# Patient Record
Sex: Male | Born: 1940 | Race: White | Hispanic: No | Marital: Married | Smoking: Former smoker
Health system: Southern US, Community
[De-identification: ages and names within clinical notes are randomized; demographics above are authoritative.]

## PROBLEM LIST (undated history)

## (undated) DIAGNOSIS — I471 Supraventricular tachycardia, unspecified: Secondary | ICD-10-CM

## (undated) DIAGNOSIS — A4102 Sepsis due to Methicillin resistant Staphylococcus aureus: Secondary | ICD-10-CM

## (undated) DIAGNOSIS — I2510 Atherosclerotic heart disease of native coronary artery without angina pectoris: Secondary | ICD-10-CM

## (undated) DIAGNOSIS — J449 Chronic obstructive pulmonary disease, unspecified: Secondary | ICD-10-CM

## (undated) DIAGNOSIS — I96 Gangrene, not elsewhere classified: Secondary | ICD-10-CM

## (undated) DIAGNOSIS — J189 Pneumonia, unspecified organism: Secondary | ICD-10-CM

## (undated) DIAGNOSIS — J9621 Acute and chronic respiratory failure with hypoxia: Secondary | ICD-10-CM

## (undated) DIAGNOSIS — C3490 Malignant neoplasm of unspecified part of unspecified bronchus or lung: Secondary | ICD-10-CM

## (undated) DIAGNOSIS — I482 Chronic atrial fibrillation, unspecified: Secondary | ICD-10-CM

## (undated) HISTORY — PX: OTHER SURGICAL HISTORY: SHX169

---

## 2017-08-31 ENCOUNTER — Inpatient Hospital Stay
Admission: RE | Admit: 2017-08-31 | Discharge: 2017-09-12 | Disposition: A | Discharge status: Other Acute Inpatient Hospital | Payer: Medicare Other | Source: Ambulatory Visit | Attending: Internal Medicine | Admitting: Internal Medicine

## 2017-08-31 ENCOUNTER — Other Ambulatory Visit (HOSPITAL_COMMUNITY): Payer: Medicare Other

## 2017-08-31 DIAGNOSIS — J9621 Acute and chronic respiratory failure with hypoxia: Secondary | ICD-10-CM | POA: Diagnosis present

## 2017-08-31 DIAGNOSIS — J189 Pneumonia, unspecified organism: Secondary | ICD-10-CM

## 2017-08-31 DIAGNOSIS — J449 Chronic obstructive pulmonary disease, unspecified: Secondary | ICD-10-CM | POA: Diagnosis present

## 2017-08-31 DIAGNOSIS — I482 Chronic atrial fibrillation, unspecified: Secondary | ICD-10-CM | POA: Diagnosis present

## 2017-08-31 DIAGNOSIS — A4102 Sepsis due to Methicillin resistant Staphylococcus aureus: Secondary | ICD-10-CM

## 2017-08-31 DIAGNOSIS — J9690 Respiratory failure, unspecified, unspecified whether with hypoxia or hypercapnia: Secondary | ICD-10-CM

## 2017-08-31 DIAGNOSIS — Z4659 Encounter for fitting and adjustment of other gastrointestinal appliance and device: Secondary | ICD-10-CM

## 2017-08-31 HISTORY — DX: Chronic atrial fibrillation, unspecified: I48.20

## 2017-08-31 HISTORY — DX: Sepsis due to methicillin resistant Staphylococcus aureus: A41.02

## 2017-08-31 HISTORY — DX: Supraventricular tachycardia, unspecified: I47.10

## 2017-08-31 HISTORY — DX: Malignant neoplasm of unspecified part of unspecified bronchus or lung: C34.90

## 2017-08-31 HISTORY — DX: Gangrene, not elsewhere classified: I96

## 2017-08-31 HISTORY — DX: Supraventricular tachycardia: I47.1

## 2017-08-31 HISTORY — DX: Atherosclerotic heart disease of native coronary artery without angina pectoris: I25.10

## 2017-08-31 HISTORY — DX: Acute and chronic respiratory failure with hypoxia: J96.21

## 2017-08-31 HISTORY — DX: Pneumonia, unspecified organism: J18.9

## 2017-08-31 HISTORY — DX: Chronic obstructive pulmonary disease, unspecified: J44.9

## 2017-08-31 LAB — BLOOD GAS, ARTERIAL
Acid-Base Excess: 14.9 mmol/L — ABNORMAL HIGH (ref 0.0–2.0)
Bicarbonate: 39.1 mmol/L — ABNORMAL HIGH (ref 20.0–28.0)
FIO2: 50
LHR: 14 {breaths}/min
MECHVT: 500 mL
O2 SAT: 97.1 %
PATIENT TEMPERATURE: 98.6
PCO2 ART: 48 mmHg (ref 32.0–48.0)
PEEP: 8 cmH2O
PH ART: 7.521 — AB (ref 7.350–7.450)
PO2 ART: 86.7 mmHg (ref 83.0–108.0)

## 2017-09-01 ENCOUNTER — Encounter: Payer: Self-pay | Admitting: Internal Medicine

## 2017-09-01 DIAGNOSIS — J189 Pneumonia, unspecified organism: Secondary | ICD-10-CM

## 2017-09-01 DIAGNOSIS — J449 Chronic obstructive pulmonary disease, unspecified: Secondary | ICD-10-CM | POA: Diagnosis not present

## 2017-09-01 DIAGNOSIS — A4102 Sepsis due to Methicillin resistant Staphylococcus aureus: Secondary | ICD-10-CM

## 2017-09-01 DIAGNOSIS — I482 Chronic atrial fibrillation, unspecified: Secondary | ICD-10-CM | POA: Diagnosis present

## 2017-09-01 DIAGNOSIS — J9621 Acute and chronic respiratory failure with hypoxia: Secondary | ICD-10-CM | POA: Diagnosis not present

## 2017-09-01 HISTORY — DX: Sepsis due to methicillin resistant Staphylococcus aureus: A41.02

## 2017-09-01 HISTORY — DX: Pneumonia, unspecified organism: J18.9

## 2017-09-01 LAB — COMPREHENSIVE METABOLIC PANEL
ALK PHOS: 97 U/L (ref 38–126)
ALT: 45 U/L (ref 17–63)
AST: 110 U/L — AB (ref 15–41)
Albumin: 1.3 g/dL — ABNORMAL LOW (ref 3.5–5.0)
Anion gap: 9 (ref 5–15)
BILIRUBIN TOTAL: 1.2 mg/dL (ref 0.3–1.2)
BUN: 26 mg/dL — AB (ref 6–20)
CALCIUM: 8.4 mg/dL — AB (ref 8.9–10.3)
CO2: 32 mmol/L (ref 22–32)
CREATININE: 0.65 mg/dL (ref 0.61–1.24)
Chloride: 103 mmol/L (ref 101–111)
GFR calc Af Amer: >60 mL/min (ref >60)
Glucose, Bld: 137 mg/dL — ABNORMAL HIGH (ref 65–99)
Potassium: 4.2 mmol/L (ref 3.5–5.1)
Sodium: 144 mmol/L (ref 135–145)
TOTAL PROTEIN: 5.5 g/dL — AB (ref 6.5–8.1)

## 2017-09-01 LAB — CBC
HCT: 32.4 % — ABNORMAL LOW (ref 39.0–52.0)
Hemoglobin: 10 g/dL — ABNORMAL LOW (ref 13.0–17.0)
MCH: 27 pg (ref 26.0–34.0)
MCHC: 30.9 g/dL (ref 30.0–36.0)
MCV: 87.6 fL (ref 78.0–100.0)
PLATELETS: 221 10*3/uL (ref 150–400)
RBC: 3.7 MIL/uL — ABNORMAL LOW (ref 4.22–5.81)
RDW: 17.9 % — AB (ref 11.5–15.5)
WBC: 16.3 10*3/uL — AB (ref 4.0–10.5)

## 2017-09-01 LAB — PROTIME-INR
INR: 1.27
Prothrombin Time: 15.8 seconds — ABNORMAL HIGH (ref 11.4–15.2)

## 2017-09-01 MED ORDER — MORPHINE SULFATE 2 MG/ML IJ SOLN
2.00 | INTRAMUSCULAR | Status: DC
Start: ? — End: 2017-09-01

## 2017-09-01 MED ORDER — SPIRONOLACTONE 25 MG PO TABS
25.00 | ORAL_TABLET | ORAL | Status: DC
Start: 2017-09-01 — End: 2017-09-01

## 2017-09-01 MED ORDER — PROPOFOL 100 MG/10ML IV EMUL
0.00 | INTRAVENOUS | Status: DC
Start: ? — End: 2017-09-01

## 2017-09-01 MED ORDER — INSULIN GLARGINE 100 UNIT/ML SOLOSTAR PEN
30.00 | PEN_INJECTOR | SUBCUTANEOUS | Status: DC
Start: 2017-09-01 — End: 2017-09-01

## 2017-09-01 MED ORDER — PANTOPRAZOLE SODIUM 40 MG IV SOLR
40.00 | INTRAVENOUS | Status: DC
Start: 2017-08-31 — End: 2017-09-01

## 2017-09-01 MED ORDER — ACETAMINOPHEN 325 MG PO TABS
650.00 | ORAL_TABLET | ORAL | Status: DC
Start: ? — End: 2017-09-01

## 2017-09-01 MED ORDER — GENERIC EXTERNAL MEDICATION
500.00 | Status: DC
Start: 2017-08-31 — End: 2017-09-01

## 2017-09-01 MED ORDER — INSULIN REGULAR HUMAN 100 UNIT/ML IJ SOLN
0.00 | INTRAMUSCULAR | Status: DC
Start: 2017-08-31 — End: 2017-09-01

## 2017-09-01 MED ORDER — GENERIC EXTERNAL MEDICATION
Status: DC
Start: ? — End: 2017-09-01

## 2017-09-01 MED ORDER — FLUCONAZOLE IN SODIUM CHLORIDE 200-0.9 MG/100ML-% IV SOLN
200.00 | INTRAVENOUS | Status: DC
Start: 2017-09-01 — End: 2017-09-01

## 2017-09-01 MED ORDER — VITAMIN C 250 MG PO TABS
500.00 | ORAL_TABLET | ORAL | Status: DC
Start: 2017-09-01 — End: 2017-09-01

## 2017-09-01 MED ORDER — ONDANSETRON HCL 4 MG/2ML IJ SOLN
4.00 | INTRAMUSCULAR | Status: DC
Start: ? — End: 2017-09-01

## 2017-09-01 MED ORDER — UMECLIDINIUM-VILANTEROL 62.5-25 MCG/INH IN AEPB
1.00 | INHALATION_SPRAY | RESPIRATORY_TRACT | Status: DC
Start: 2017-09-01 — End: 2017-09-01

## 2017-09-01 MED ORDER — ATORVASTATIN CALCIUM 40 MG PO TABS
40.00 | ORAL_TABLET | ORAL | Status: DC
Start: 2017-08-31 — End: 2017-09-01

## 2017-09-01 MED ORDER — BISACODYL 5 MG PO TBEC
10.00 | DELAYED_RELEASE_TABLET | ORAL | Status: DC
Start: ? — End: 2017-09-01

## 2017-09-01 MED ORDER — POLYETHYLENE GLYCOL 3350 17 G PO PACK
17.00 | PACK | ORAL | Status: DC
Start: ? — End: 2017-09-01

## 2017-09-01 MED ORDER — HYDROCODONE-ACETAMINOPHEN 5-325 MG PO TABS
1.00 | ORAL_TABLET | ORAL | Status: DC
Start: ? — End: 2017-09-01

## 2017-09-01 MED ORDER — NITROGLYCERIN 0.4 MG SL SUBL
0.40 | SUBLINGUAL_TABLET | SUBLINGUAL | Status: DC
Start: ? — End: 2017-09-01

## 2017-09-01 MED ORDER — VITAMIN B-12 500 MCG PO TABS
500.00 | ORAL_TABLET | ORAL | Status: DC
Start: 2017-09-01 — End: 2017-09-01

## 2017-09-01 MED ORDER — POVIDONE-IODINE 10 % EX OINT
1.00 | TOPICAL_OINTMENT | CUTANEOUS | Status: DC
Start: 2017-09-01 — End: 2017-09-01

## 2017-09-01 MED ORDER — CHOLECALCIFEROL 25 MCG (1000 UT) PO TABS
2000.00 | ORAL_TABLET | ORAL | Status: DC
Start: 2017-09-01 — End: 2017-09-01

## 2017-09-01 MED ORDER — ALBUTEROL SULFATE (2.5 MG/3ML) 0.083% IN NEBU
2.50 | INHALATION_SOLUTION | RESPIRATORY_TRACT | Status: DC
Start: ? — End: 2017-09-01

## 2017-09-01 MED ORDER — GENERIC EXTERNAL MEDICATION
90.00 | Status: DC
Start: 2017-08-31 — End: 2017-09-01

## 2017-09-01 NOTE — Consult Note (Signed)
Pulmonary [place]  PULMONARY SERVICE  Date of Service: 09/01/2017  PULMONARY MOHD. DERFLINGER Molinelli  PNT:[id number]  DOB: 10/24/1940   DOA: 08/31/2017  Referring Physician: Merton Border, MD  HPI: Dwayne Griffin is a 77 y.o. male seen for follow up of Acute on Chronic Respiratory Failure.  Complicated course at the transferring facility.  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   patient felt fairly well however had worsening of his symptoms.  He was taken to the OR after an acute change in status patient was transferred to the OR and had the amputation performed.  Postoperatively brought back on the ventilator.  Chest x-ray showed a lower lobe pneumonia versus atelectasis.  Patient was started on IV antibiotics.  Had ongoing fevers noted but the markers for sepsis seem to be improving.  Other complications included development of atrial fibrillation with rapid ventricular response.  Patient was seen by cardiology to help control the heart rate.  Patient was continued on IV vancomycin as well as imipenem for antibiotic selection.  Presents to our facility on the ventilator and assist control mode on 50% FiO2 with an ET tube in place.  Apparently had been extubated and failed and was reintubated prior to transfer.  Propofol.  He continues on antibiotics.  Review of Systems:  ROS performed and is unremarkable other than noted above.  Past Medical History:  Diagnosis Date  . Acute on chronic respiratory failure with hypoxemia ([place])   . Chronic atrial fibrillation ([place])   . COPD (chronic obstructive pulmonary disease) ([place])   . Coronary artery disease   . Dry gangrene ([place])   . Lung cancer ([place])   . Supraventricular tachycardia [facility])     Past Surgical History:  Procedure Laterality Date  . Amputation left lower extremity    . Central  line placement    . Right upper lobectomy      Social History:    reports that he has quit smoking. He has never used smokeless tobacco. He reports that he drank alcohol. He reports that he has current or past drug history.  Family History: Non-Contributory to the present illness  Allergies not on file  Medications: Reviewed on Rounds  Physical Exam:  Vitals: Temperature 99.6 pulse 98 respiratory rate 23 blood pressure 127/63 saturations 96%  Ventilator Settings assist control FiO2 50% tidal volume 526 PEEP 8  . General: Comfortable at this time . Eyes: Grossly normal lids, irises & conjunctiva . ENT: grossly tongue is normal . Neck: no obvious mass . Cardiovascular: S1-S2 normal regular rhythm no gallop . Respiratory: No rhonchi expansion is equal . Abdomen: Soft and nontender . Skin: no rash seen on limited exam . Musculoskeletal: not rigid . Psychiatric:unable to assess . Neurologic: no seizure no involuntary movements         Labs on Admission:  Basic Metabolic Panel: Recent Labs  Lab 09/01/17 0536  NA 144  K 4.2  CL 103  CO2 32  GLUCOSE 137*  BUN 26*  CREATININE 0.65  CALCIUM 8.4*    Liver Function Tests: Recent Labs  Lab 09/01/17 0536  AST 110*  ALT 45  ALKPHOS 97  BILITOT 1.2  PROT 5.5*  ALBUMIN 1.3*   No results for input(s): LIPASE, AMYLASE in the last 168 hours. No results for input(s): AMMONIA in  the last 168 hours.  CBC: Recent Labs  Lab 09/01/17 0536  WBC 16.3*  HGB 10.0*  HCT 32.4*  MCV 87.6  PLT 221    Cardiac Enzymes: No results for input(s): CKTOTAL, CKMB, CKMBINDEX, TROPONINI in the last 168 hours.  BNP (last 3 results) No results for input(s): BNP in the last 8760 hours.  ProBNP (last 3 results) No results for input(s): PROBNP in the last 8760 hours.   Radiological Exams on Admission: Dg Chest Port 1 View  Result Date: 08/31/2017 CLINICAL DATA:  Respiratory failure EXAM: PORTABLE CHEST 1 VIEW COMPARISON:  None.  FINDINGS: Cardiomegaly. There are bilateral pleural effusions, left greater than right. Vascular congestion. Bibasilar atelectasis or infiltrates, left greater than right. Endotracheal tube is approximately 6 cm above the carina. NG tube enters the stomach. Right central line tip is in the right innominate vein. IMPRESSION: Cardiomegaly. Bilateral pleural effusions, left greater than right. Bibasilar atelectasis or infiltrates, left greater than right. Electronically Signed   By: Rolm Baptise M.D.   On: 08/31/2017 18:52   Dg Abd Portable 1v  Result Date: 08/31/2017 CLINICAL DATA:  NG tube placement EXAM: PORTABLE ABDOMEN - 1 VIEW COMPARISON:  None. FINDINGS: NG tube tip is in the distal stomach region. Nonobstructive bowel gas pattern. IMPRESSION: NG tube tip in the distal stomach. Electronically Signed   By: Rolm Baptise M.D.   On: 08/31/2017 18:52    Assessment/Plan Principal Problem:   Acute on chronic respiratory failure with hypoxemia (HCC) Active Problems:   Chronic atrial fibrillation (HCC)   COPD (chronic obstructive pulmonary disease) (HCC)   Sepsis due to methicillin resistant Staphylococcus aureus (MRSA) (HCC)   Healthcare-associated pneumonia   1. Acute on chronic respiratory failure with hypoxia at this time patient will be continued on full vent support.  Patient has an ET tube in place has failed numerous times a trials of extubation.  More than likely will need to have a tracheostomy done.  Propofol for sedation and is comfortable. 2. Chronic atrial fibrillation rate is controlled we will 3. COPD at baseline we will continue pulmonary toilet supportive care 4. Sepsis was treated hemodynamically appears to be stable we will continue to monitor 5. Healthcare associated pneumonia follow-up x-rays radiologically seems to be improving.  There may be some basilar atelectasis noted with some effusions also on the chest films.  Meropenem will be continued  I have personally seen and  evaluated the patient, evaluated laboratory and imaging results, formulated the assessment and plan and placed orders. The Patient requires high complexity decision making for assessment and support.  Case was discussed on Rounds with the Respiratory Therapy Staff  Patient is critically ill and in danger of cardiac arrest.  Patient requires ongoing advanced monitoring techniques and sedation protocols.  Time spent 35 minutes  Allyne Gee, MD [facility] Pulmonary Critical Care Medicine Sleep Medicine

## 2017-09-02 DIAGNOSIS — J9621 Acute and chronic respiratory failure with hypoxia: Secondary | ICD-10-CM | POA: Diagnosis not present

## 2017-09-02 DIAGNOSIS — J449 Chronic obstructive pulmonary disease, unspecified: Secondary | ICD-10-CM | POA: Diagnosis not present

## 2017-09-02 DIAGNOSIS — A4102 Sepsis due to Methicillin resistant Staphylococcus aureus: Secondary | ICD-10-CM | POA: Diagnosis not present

## 2017-09-02 DIAGNOSIS — J189 Pneumonia, unspecified organism: Secondary | ICD-10-CM | POA: Diagnosis not present

## 2017-09-02 DIAGNOSIS — I482 Chronic atrial fibrillation: Secondary | ICD-10-CM | POA: Diagnosis not present

## 2017-09-02 NOTE — Progress Notes (Signed)
Pulmonary [place]   PULMONARY SERVICE  PROGRESS NOTE  Date of Service: 09/02/2017  KIELAN DREISBACH  OZH:[id number]  DOB: 11-13-1940   DOA: 08/31/2017  Referring Physician: Merton Border, MD  HPI: Dwayne Griffin is a 77 y.o. male seen for follow up of Acute on Chronic Respiratory Failure.  Patient remains orally intubated on the ventilator currently on assist control mode.  Right now is on 40% oxygen with PEEP of 8.  Mechanics are poor patient right now is not able to wean.  Still requiring propofol for sedation.  Medications: Reviewed on Rounds  Physical Exam:  Vitals: Temperature 98.9 pulse 92 respiratory rate 21 blood pressure 121/60 saturations 90%  Ventilator Settings mode of ventilation assist control FiO2 40% tidal volume 621 PEEP 8  . General: Comfortable at this time . Eyes: Grossly normal lids, irises & conjunctiva . ENT: grossly tongue is normal . Neck: no obvious mass . Cardiovascular: S1 S2 normal no gallop . Respiratory: Coarse breath sounds expansion is equal bilaterally . Abdomen: soft . Skin: no rash seen on limited exam . Musculoskeletal: not rigid . Psychiatric:unable to assess . Neurologic: no seizure no involuntary movements         Labs on Admission:  Basic Metabolic Panel: Recent Labs  Lab 09/01/17 0536  NA 144  K 4.2  CL 103  CO2 32  GLUCOSE 137*  BUN 26*  CREATININE 0.65  CALCIUM 8.4*    Liver Function Tests: Recent Labs  Lab 09/01/17 0536  AST 110*  ALT 45  ALKPHOS 97  BILITOT 1.2  PROT 5.5*  ALBUMIN 1.3*   No results for input(s): LIPASE, AMYLASE in the last 168 hours. No results for input(s): AMMONIA in the last 168 hours.  CBC: Recent Labs  Lab 09/01/17 0536  WBC 16.3*  HGB 10.0*  HCT 32.4*  MCV 87.6  PLT 221    Cardiac Enzymes: No results for input(s): CKTOTAL, CKMB, CKMBINDEX, TROPONINI in the last 168 hours.  BNP (last 3 results) No results for input(s): BNP in  the last 8760 hours.  ProBNP (last 3 results) No results for input(s): PROBNP in the last 8760 hours.  Radiological Exams on Admission: Dg Chest Port 1 View  Result Date: 08/31/2017 CLINICAL DATA:  Respiratory failure EXAM: PORTABLE CHEST 1 VIEW COMPARISON:  None. FINDINGS: Cardiomegaly. There are bilateral pleural effusions, left greater than right. Vascular congestion. Bibasilar atelectasis or infiltrates, left greater than right. Endotracheal tube is approximately 6 cm above the carina. NG tube enters the stomach. Right central line tip is in the right innominate vein. IMPRESSION: Cardiomegaly. Bilateral pleural effusions, left greater than right. Bibasilar atelectasis or infiltrates, left greater than right. Electronically Signed   By: Rolm Baptise M.D.   On: 08/31/2017 18:52   Dg Abd Portable 1v  Result Date: 08/31/2017 CLINICAL DATA:  NG tube placement EXAM: PORTABLE ABDOMEN - 1 VIEW COMPARISON:  None. FINDINGS: NG tube tip is in the distal stomach region. Nonobstructive bowel gas pattern. IMPRESSION: NG tube tip in the distal stomach. Electronically Signed   By: Rolm Baptise M.D.   On: 08/31/2017 18:52    Assessment/Plan Principal Problem:   Acute on chronic respiratory failure with hypoxemia (HCC) Active Problems:   Chronic atrial fibrillation (HCC)   COPD (chronic obstructive pulmonary disease) (HCC)   Sepsis due to methicillin resistant Staphylococcus aureus (MRSA) (HCC)   Healthcare-associated pneumonia   1. Acute on chronic respiratory failure with hypoxia continue with full  vent support.  Patient's mechanics are poor not a candidate for weaning at this time.  Patient needs to have a tracheostomy done consultation was requested with ENT.  We will continue pulmonary toilet secretion management.  Patient remains critically ill in danger of cardiac arrest with a high risk airway 2. Chronic atrial fibrillation rate is controlled we will continue with supportive care 3. COPD  advanced disease we will continue present management. 4. Sepsis secondary to MRSA patient has been treated with antibiotics we will continue with supportive care.  The last chest x-ray was done showed bilateral effusions and cardiomegaly.  Prognosis guarded 5. Healthcare associated pneumonia treated with antibiotics we will continue with supportive care follow-up x-rays as deemed necessary.  Right now patient is not able to wean we will continue with present management   I have personally seen and evaluated the patient, evaluated laboratory and imaging results, formulated the assessment and plan and placed orders. The Patient requires high complexity decision making for assessment and support.  Case was discussed on Rounds with the Respiratory Therapy Staff Patient is critically ill in danger of cardiac arrest time spent 35 minutes critical care  Allyne Gee, MD [facility] Pulmonary Critical Care Medicine Sleep Medicine

## 2017-09-04 LAB — CBC
HEMATOCRIT: 34.3 % — AB (ref 39.0–52.0)
Hemoglobin: 10.3 g/dL — ABNORMAL LOW (ref 13.0–17.0)
MCH: 26.8 pg (ref 26.0–34.0)
MCHC: 30 g/dL (ref 30.0–36.0)
MCV: 89.3 fL (ref 78.0–100.0)
PLATELETS: 269 10*3/uL (ref 150–400)
RBC: 3.84 MIL/uL — ABNORMAL LOW (ref 4.22–5.81)
RDW: 17.4 % — AB (ref 11.5–15.5)
WBC: 12.7 10*3/uL — AB (ref 4.0–10.5)

## 2017-09-05 DIAGNOSIS — J9621 Acute and chronic respiratory failure with hypoxia: Secondary | ICD-10-CM | POA: Diagnosis not present

## 2017-09-05 DIAGNOSIS — J449 Chronic obstructive pulmonary disease, unspecified: Secondary | ICD-10-CM | POA: Diagnosis not present

## 2017-09-05 DIAGNOSIS — A4102 Sepsis due to Methicillin resistant Staphylococcus aureus: Secondary | ICD-10-CM | POA: Diagnosis not present

## 2017-09-05 DIAGNOSIS — J189 Pneumonia, unspecified organism: Secondary | ICD-10-CM | POA: Diagnosis not present

## 2017-09-05 DIAGNOSIS — I482 Chronic atrial fibrillation: Secondary | ICD-10-CM | POA: Diagnosis not present

## 2017-09-05 NOTE — Progress Notes (Signed)
Pulmonary [place]   PULMONARY SERVICE  PROGRESS NOTE  Date of Service: 09/05/2017  Dwayne Griffin  WVP:[id number]  DOB: 12/01/1940   DOA: 08/31/2017  Referring Physician: Merton Border, MD  HPI: Dwayne Griffin is a 77 y.o. male seen for follow up of Acute on Chronic Respiratory Failure.  Patient is on pressure support weaning currently was on 40% oxygen with a pressure support 12/5 seems to be tolerating it fairly well.  Medications: Reviewed on Rounds  Physical Exam:  Vitals: Temperature 97.6 pulse 94 respiratory 24 blood pressure 124/66 saturations 94%  Ventilator Settings mode of ventilation pressure support FiO2 40% tidal volume 400 pressure support 12/5  . General: Comfortable at this time . Eyes: Grossly normal lids, irises & conjunctiva . ENT: grossly tongue is normal . Neck: no obvious mass . Cardiovascular: S1 S2 normal no gallop . Respiratory: Coarse breath sounds scattered rhonchi . Abdomen: soft . Skin: no rash seen on limited exam . Musculoskeletal: not rigid . Psychiatric:unable to assess . Neurologic: no seizure no involuntary movements         Labs on Admission:  Basic Metabolic Panel: Recent Labs  Lab 09/01/17 0536  NA 144  K 4.2  CL 103  CO2 32  GLUCOSE 137*  BUN 26*  CREATININE 0.65  CALCIUM 8.4*    Liver Function Tests: Recent Labs  Lab 09/01/17 0536  AST 110*  ALT 45  ALKPHOS 97  BILITOT 1.2  PROT 5.5*  ALBUMIN 1.3*   No results for input(s): LIPASE, AMYLASE in the last 168 hours. No results for input(s): AMMONIA in the last 168 hours.  CBC: Recent Labs  Lab 09/01/17 0536 09/04/17 0520  WBC 16.3* 12.7*  HGB 10.0* 10.3*  HCT 32.4* 34.3*  MCV 87.6 89.3  PLT 221 269    Cardiac Enzymes: No results for input(s): CKTOTAL, CKMB, CKMBINDEX, TROPONINI in the last 168 hours.  BNP (last 3 results) No results for input(s): BNP in the last 8760 hours.  ProBNP (last 3 results) No  results for input(s): PROBNP in the last 8760 hours.  Radiological Exams on Admission: No results found.  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 Acute on chronic respiratory failure with hypoxia we will continue with weaning on pressure support goal for 4 hours now.  Continue to titrate oxygen down supportive care 2. Chronic atrial fibrillation severe disease we will continue to monitor 3. COPD advanced disease we will follow 4. Sepsis due to MRSA stable at this time we will continue present management 5. Healthcare associated pneumonia treated with antibiotics we will continue to monitor   I have personally seen and evaluated the patient, evaluated laboratory and imaging results, formulated the assessment and plan and placed orders. The Patient requires high complexity decision making for assessment and support.  Case was discussed on Rounds with the Respiratory Therapy Staff  Allyne Gee, MD [facility] Pulmonary Critical Care Medicine Sleep Medicine

## 2017-09-06 ENCOUNTER — Other Ambulatory Visit (HOSPITAL_COMMUNITY): Payer: Medicare Other

## 2017-09-06 DIAGNOSIS — I482 Chronic atrial fibrillation: Secondary | ICD-10-CM

## 2017-09-06 DIAGNOSIS — A4102 Sepsis due to Methicillin resistant Staphylococcus aureus: Secondary | ICD-10-CM

## 2017-09-06 DIAGNOSIS — J189 Pneumonia, unspecified organism: Secondary | ICD-10-CM | POA: Diagnosis not present

## 2017-09-06 DIAGNOSIS — J9621 Acute and chronic respiratory failure with hypoxia: Secondary | ICD-10-CM | POA: Diagnosis not present

## 2017-09-06 DIAGNOSIS — J449 Chronic obstructive pulmonary disease, unspecified: Secondary | ICD-10-CM

## 2017-09-06 LAB — BASIC METABOLIC PANEL
ANION GAP: 8 (ref 5–15)
BUN: 28 mg/dL — ABNORMAL HIGH (ref 6–20)
CO2: 32 mmol/L (ref 22–32)
Calcium: 9.1 mg/dL (ref 8.9–10.3)
Chloride: 95 mmol/L — ABNORMAL LOW (ref 101–111)
Creatinine, Ser: 0.61 mg/dL (ref 0.61–1.24)
GFR calc Af Amer: >60 mL/min (ref >60)
GLUCOSE: 245 mg/dL — AB (ref 65–99)
POTASSIUM: 5.4 mmol/L — AB (ref 3.5–5.1)
Sodium: 135 mmol/L (ref 135–145)

## 2017-09-06 LAB — CBC
HEMATOCRIT: 36.6 % — AB (ref 39.0–52.0)
Hemoglobin: 11.3 g/dL — ABNORMAL LOW (ref 13.0–17.0)
MCH: 27.1 pg (ref 26.0–34.0)
MCHC: 30.9 g/dL (ref 30.0–36.0)
MCV: 87.8 fL (ref 78.0–100.0)
Platelets: 404 10*3/uL — ABNORMAL HIGH (ref 150–400)
RBC: 4.17 MIL/uL — AB (ref 4.22–5.81)
RDW: 17.3 % — ABNORMAL HIGH (ref 11.5–15.5)
WBC: 12.9 10*3/uL — AB (ref 4.0–10.5)

## 2017-09-06 MED ORDER — GENERIC EXTERNAL MEDICATION
Status: DC
Start: ? — End: 2017-09-06

## 2017-09-06 NOTE — Progress Notes (Signed)
Pulmonary [place]   PULMONARY SERVICE  PROGRESS NOTE  Date of Service: 09/06/2017  Dwayne Griffin  QMV:[id number]  DOB: [birth year]   DOA: 08/31/2017  Referring Physician: Merton Border, MD  HPI: Dwayne Griffin is a 77 y.o. male seen for follow up of Acute on Chronic Respiratory Failure.  Comfortable without distress remains on pressure support while waiting trach  Medications: Reviewed on Rounds  Physical Exam:  Vitals: Temperature 97.6 pulse 67 respiratory rate 20 blood pressure 97/76 saturations 97%  Ventilator Settings mode of ventilation pressure support FiO2 40% tidal volume 428 per support 12/5  . General: Comfortable at this time . Eyes: Grossly normal lids, irises & conjunctiva . ENT: grossly tongue is normal . Neck: no obvious mass . Cardiovascular: S1 S2 normal no gallop . Respiratory: No rhonchi rales expansion is equal . Abdomen: soft . Skin: no rash seen on limited exam . Musculoskeletal: not rigid . Psychiatric:unable to assess . Neurologic: no seizure no involuntary movements         Labs on Admission:  Basic Metabolic Panel: Recent Labs  Lab 09/01/17 0536 09/06/17 0838  NA 144 135  K 4.2 5.4*  CL 103 95*  CO2 32 32  GLUCOSE 137* 245*  BUN 26* 28*  CREATININE 0.65 0.61  CALCIUM 8.4* 9.1    Liver Function Tests: Recent Labs  Lab 09/01/17 0536  AST 110*  ALT 45  ALKPHOS 97  BILITOT 1.2  PROT 5.5*  ALBUMIN 1.3*   No results for input(s): LIPASE, AMYLASE in the last 168 hours. No results for input(s): AMMONIA in the last 168 hours.  CBC: Recent Labs  Lab 09/01/17 0536 09/04/17 0520 09/06/17 0838  WBC 16.3* 12.7* 12.9*  HGB 10.0* 10.3* 11.3*  HCT 32.4* 34.3* 36.6*  MCV 87.6 89.3 87.8  PLT 221 269 404*    Cardiac Enzymes: No results for input(s): CKTOTAL, CKMB, CKMBINDEX, TROPONINI in the last 168 hours.  BNP (last 3 results) No results for input(s): BNP in the last 8760  hours.  ProBNP (last 3 results) No results for input(s): PROBNP in the last 8760 hours.  Radiological Exams on Admission: Dg Chest Port 1 View  Result Date: 09/06/2017 CLINICAL DATA:  Respiratory failure, intubated patient, sepsis, COPD, coronary artery disease. EXAM: PORTABLE CHEST 1 VIEW COMPARISON:  Portable chest x-ray of Aug 31, 2017 FINDINGS: The lungs are reasonably well inflated. Bibasilar densities persist greatest on the left. There is no pneumothorax. The cardiac silhouette is enlarged. The pulmonary vascularity is not clearly engorged. The interstitial markings of both lungs have improved. There is an endotracheal tube present whose tip projects approximately 6 cm above the carina. The esophagogastric tube tip in proximal port project below the GE junction. The right internal jugular venous catheter tip projects at the junction of the right and left brachiocephalic veins. IMPRESSION: Mild improvement in the appearance of the pulmonary interstitium bilaterally may reflect resolving pulmonary edema. Decreased pleural effusions. Persistent left basilar atelectasis or pneumonia and likely minimal right basilar atelectasis. No large pleural effusion. The support tubes are in reasonable position. Electronically Signed   By: David  Martinique M.D.   On: 09/06/2017 07:44    Assessment/Plan Principal Problem:   Acute on chronic respiratory failure with hypoxemia (HCC) Active Problems:   Chronic atrial fibrillation (HCC)   COPD (chronic obstructive pulmonary disease) (HCC)   Sepsis due to methicillin resistant Staphylococcus aureus (MRSA) (HCC)   Healthcare-associated pneumonia   1. Acute on  chronic respiratory failure with hypoxia we will continue with pressure support mode continue pulmonary toilet secretion management supportive care patient's awaiting trach 2. Chronic atrial fibrillation rate is controlled 3. COPD severe disease 4. Sepsis due to MRSA treated with antibiotics 5. Healthcare  associated pneumonia treated we will continue supportive care   I have personally seen and evaluated the patient, evaluated laboratory and imaging results, formulated the assessment and plan and placed orders. The Patient requires high complexity decision making for assessment and support.  Case was discussed on Rounds with the Respiratory Therapy Staff  Allyne Gee, MD [facility] Pulmonary Critical Care Medicine Sleep Medicine

## 2017-09-07 ENCOUNTER — Other Ambulatory Visit (HOSPITAL_COMMUNITY): Payer: Medicare Other

## 2017-09-07 DIAGNOSIS — J449 Chronic obstructive pulmonary disease, unspecified: Secondary | ICD-10-CM | POA: Diagnosis not present

## 2017-09-07 DIAGNOSIS — J9621 Acute and chronic respiratory failure with hypoxia: Secondary | ICD-10-CM | POA: Diagnosis not present

## 2017-09-07 DIAGNOSIS — J189 Pneumonia, unspecified organism: Secondary | ICD-10-CM | POA: Diagnosis not present

## 2017-09-07 DIAGNOSIS — I482 Chronic atrial fibrillation: Secondary | ICD-10-CM | POA: Diagnosis not present

## 2017-09-07 LAB — POTASSIUM: POTASSIUM: 4.6 mmol/L (ref 3.5–5.1)

## 2017-09-07 NOTE — Progress Notes (Signed)
Pulmonary [place]   PULMONARY SERVICE  PROGRESS NOTE  Date of Service: 09/07/2017  Dwayne Griffin  ZOX:[id number]  DOB: Apr 23, 1940   DOA: 08/31/2017  Referring Physician: Merton Border, MD  HPI: Dwayne Griffin is a 77 y.o. male seen for follow up of Acute on Chronic Respiratory Failure.  Comfortable without distress right now is on pressure support mode patient's been on 40% oxygen at this time  Medications: Reviewed on Rounds  Physical Exam:  Vitals: Temperature 96.7 pulse 86 respiratory rate 22 blood pressure 107/67 saturations 96%  Ventilator Settings mode of ventilation pressure support FiO2 40% tidal volume 426 pressure support 12 PEEP 5  . General: Comfortable at this time . Eyes: Grossly normal lids, irises & conjunctiva . ENT: grossly tongue is normal . Neck: no obvious mass . Cardiovascular: S1 S2 normal no gallop . Respiratory: No rhonchi or rales are noted . Abdomen: soft . Skin: no rash seen on limited exam . Musculoskeletal: not rigid . Psychiatric:unable to assess . Neurologic: no seizure no involuntary movements         Labs on Admission:  Basic Metabolic Panel: Recent Labs  Lab 09/01/17 0536 09/06/17 0838 09/07/17 0819  NA 144 135  --   K 4.2 5.4* 4.6  CL 103 95*  --   CO2 32 32  --   GLUCOSE 137* 245*  --   BUN 26* 28*  --   CREATININE 0.65 0.61  --   CALCIUM 8.4* 9.1  --     Liver Function Tests: Recent Labs  Lab 09/01/17 0536  AST 110*  ALT 45  ALKPHOS 97  BILITOT 1.2  PROT 5.5*  ALBUMIN 1.3*   No results for input(s): LIPASE, AMYLASE in the last 168 hours. No results for input(s): AMMONIA in the last 168 hours.  CBC: Recent Labs  Lab 09/01/17 0536 09/04/17 0520 09/06/17 0838  WBC 16.3* 12.7* 12.9*  HGB 10.0* 10.3* 11.3*  HCT 32.4* 34.3* 36.6*  MCV 87.6 89.3 87.8  PLT 221 269 404*    Cardiac Enzymes: No results for input(s): CKTOTAL, CKMB, CKMBINDEX, TROPONINI in the last  168 hours.  BNP (last 3 results) No results for input(s): BNP in the last 8760 hours.  ProBNP (last 3 results) No results for input(s): PROBNP in the last 8760 hours.  Radiological Exams on Admission: Dg Chest Port 1 View  Result Date: 09/06/2017 CLINICAL DATA:  Respiratory failure, intubated patient, sepsis, COPD, coronary artery disease. EXAM: PORTABLE CHEST 1 VIEW COMPARISON:  Portable chest x-ray of Aug 31, 2017 FINDINGS: The lungs are reasonably well inflated. Bibasilar densities persist greatest on the left. There is no pneumothorax. The cardiac silhouette is enlarged. The pulmonary vascularity is not clearly engorged. The interstitial markings of both lungs have improved. There is an endotracheal tube present whose tip projects approximately 6 cm above the carina. The esophagogastric tube tip in proximal port project below the GE junction. The right internal jugular venous catheter tip projects at the junction of the right and left brachiocephalic veins. IMPRESSION: Mild improvement in the appearance of the pulmonary interstitium bilaterally may reflect resolving pulmonary edema. Decreased pleural effusions. Persistent left basilar atelectasis or pneumonia and likely minimal right basilar atelectasis. No large pleural effusion. The support tubes are in reasonable position. Electronically Signed   By: David  Martinique M.D.   On: 09/06/2017 07:44    Assessment/Plan Principal Problem:   Acute on chronic respiratory failure with hypoxemia (HCC) Active Problems:  Chronic atrial fibrillation (HCC)   COPD (chronic obstructive pulmonary disease) (HCC)   Sepsis due to methicillin resistant Staphylococcus aureus (MRSA) (HCC)   Healthcare-associated pneumonia   1. Acute on chronic respiratory failure with hypoxia patient is doing fine on pressure support tracheostomy is scheduled for next week will continue with supportive care and monitor. 2. Chronic atrial fibrillation rate is controlled we will  continue to follow 3. COPD severe disease 4. Sepsis due to MRSA we will continue to monitor has been treated 5. Healthcare associated pneumonia treated   I have personally seen and evaluated the patient, evaluated laboratory and imaging results, formulated the assessment and plan and placed orders. The Patient requires high complexity decision making for assessment and support.  Case was discussed on Rounds with the Respiratory Therapy Staff  Allyne Gee, MD [facility] Pulmonary Critical Care Medicine Sleep Medicine

## 2017-09-08 DIAGNOSIS — I482 Chronic atrial fibrillation: Secondary | ICD-10-CM | POA: Diagnosis not present

## 2017-09-08 DIAGNOSIS — J449 Chronic obstructive pulmonary disease, unspecified: Secondary | ICD-10-CM | POA: Diagnosis not present

## 2017-09-08 DIAGNOSIS — J9621 Acute and chronic respiratory failure with hypoxia: Secondary | ICD-10-CM | POA: Diagnosis not present

## 2017-09-08 DIAGNOSIS — A4102 Sepsis due to Methicillin resistant Staphylococcus aureus: Secondary | ICD-10-CM | POA: Diagnosis not present

## 2017-09-08 DIAGNOSIS — J189 Pneumonia, unspecified organism: Secondary | ICD-10-CM | POA: Diagnosis not present

## 2017-09-08 NOTE — Progress Notes (Signed)
Pulmonary [place]   PULMONARY SERVICE  PROGRESS NOTE  Date of Service: 09/08/2017  Dwayne Griffin  LEX:[id number]  DOB: 1940-08-20   DOA: 08/31/2017  Referring Physician: Merton Border, MD  HPI: Dwayne Griffin is a 77 y.o. male seen for follow up of Acute on Chronic Respiratory Failure.  Currently on pressure support has been on 40% oxygen.  We are waiting for the tracheostomy to be done  Medications: Reviewed on Rounds  Physical Exam:  Vitals: Temperature 97.2 pulse 72 respiratory rate 15 blood pressure 96/62 saturations 95%  Ventilator Settings mode of ventilation pressure support FiO2 40% tidal volume 530 pressure support 12/5  . General: Comfortable at this time . Eyes: Grossly normal lids, irises & conjunctiva . ENT: grossly tongue is normal . Neck: no obvious mass . Cardiovascular: S1 S2 normal no gallop . Respiratory: Scattered rhonchi expansion is equal . Abdomen: soft . Skin: no rash seen on limited exam . Musculoskeletal: not rigid . Psychiatric:unable to assess . Neurologic: no seizure no involuntary movements         Labs on Admission:  Basic Metabolic Panel: Recent Labs  Lab 09/06/17 0838 09/07/17 0819  NA 135  --   K 5.4* 4.6  CL 95*  --   CO2 32  --   GLUCOSE 245*  --   BUN 28*  --   CREATININE 0.61  --   CALCIUM 9.1  --     Liver Function Tests: No results for input(s): AST, ALT, ALKPHOS, BILITOT, PROT, ALBUMIN in the last 168 hours. No results for input(s): LIPASE, AMYLASE in the last 168 hours. No results for input(s): AMMONIA in the last 168 hours.  CBC: Recent Labs  Lab 09/04/17 0520 09/06/17 0838  WBC 12.7* 12.9*  HGB 10.3* 11.3*  HCT 34.3* 36.6*  MCV 89.3 87.8  PLT 269 404*    Cardiac Enzymes: No results for input(s): CKTOTAL, CKMB, CKMBINDEX, TROPONINI in the last 168 hours.  BNP (last 3 results) No results for input(s): BNP in the last 8760 hours.  ProBNP (last 3  results) No results for input(s): PROBNP in the last 8760 hours.  Radiological Exams on Admission: Dg Abd 1 View  Result Date: 09/07/2017 CLINICAL DATA:  Orogastric tube placement EXAM: ABDOMEN - 1 VIEW COMPARISON:  08/31/2017 FINDINGS: The tip of the orogastric tube is in the body of the stomach. Gas-filled loops of small and large bowel are scattered across the abdomen and a mild ileus pattern. There is no obvious free intraperitoneal gas. IMPRESSION: NG tube tip is in the body of the stomach. Mild ileus pattern. Electronically Signed   By: Marybelle Killings M.D.   On: 09/07/2017 19:35   Dg Chest Port 1 View  Result Date: 09/06/2017 CLINICAL DATA:  Respiratory failure, intubated patient, sepsis, COPD, coronary artery disease. EXAM: PORTABLE CHEST 1 VIEW COMPARISON:  Portable chest x-ray of Aug 31, 2017 FINDINGS: The lungs are reasonably well inflated. Bibasilar densities persist greatest on the left. There is no pneumothorax. The cardiac silhouette is enlarged. The pulmonary vascularity is not clearly engorged. The interstitial markings of both lungs have improved. There is an endotracheal tube present whose tip projects approximately 6 cm above the carina. The esophagogastric tube tip in proximal port project below the GE junction. The right internal jugular venous catheter tip projects at the junction of the right and left brachiocephalic veins. IMPRESSION: Mild improvement in the appearance of the pulmonary interstitium bilaterally may reflect resolving  pulmonary edema. Decreased pleural effusions. Persistent left basilar atelectasis or pneumonia and likely minimal right basilar atelectasis. No large pleural effusion. The support tubes are in reasonable position. Electronically Signed   By: David  Martinique M.D.   On: 09/06/2017 07:44    Assessment/Plan Principal Problem:   Acute on chronic respiratory failure with hypoxemia (HCC) Active Problems:   Chronic atrial fibrillation (HCC)   COPD (chronic  obstructive pulmonary disease) (HCC)   Sepsis due to methicillin resistant Staphylococcus aureus (MRSA) (HCC)   Healthcare-associated pneumonia   1. Acute on chronic respiratory failure with hypoxia we will continue with oxygen therapy and current pressure support mode.  I think once a tracheostomy is done should be easier to wean.  We will continue with supportive care 2. Chronic atrial fibrillation rate is controlled we will continue to monitor 3. COPD at baseline we will continue to follow 4. Sepsis clinically resolved hemodynamically stable 5. Healthcare associated pneumonia treated with antibiotics some improvement noted   I have personally seen and evaluated the patient, evaluated laboratory and imaging results, formulated the assessment and plan and placed orders. The Patient requires high complexity decision making for assessment and support.  Case was discussed on Rounds with the Respiratory Therapy Staff  Allyne Gee, MD [facility] Pulmonary Critical Care Medicine Sleep Medicine

## 2017-09-09 DIAGNOSIS — J449 Chronic obstructive pulmonary disease, unspecified: Secondary | ICD-10-CM | POA: Diagnosis not present

## 2017-09-09 DIAGNOSIS — J189 Pneumonia, unspecified organism: Secondary | ICD-10-CM | POA: Diagnosis not present

## 2017-09-09 DIAGNOSIS — I482 Chronic atrial fibrillation: Secondary | ICD-10-CM | POA: Diagnosis not present

## 2017-09-09 DIAGNOSIS — J9621 Acute and chronic respiratory failure with hypoxia: Secondary | ICD-10-CM | POA: Diagnosis not present

## 2017-09-09 NOTE — Progress Notes (Signed)
Pulmonary Dwayne Griffin   PULMONARY SERVICE  PROGRESS NOTE  Date of Service: 09/09/2017  RICHRD KUZNIAR  ZLD:[id number]  DOB: December 23, 1940   DOA: 08/31/2017  Referring Physician: Merton Border, MD  HPI: BRAYON BIELEFELD is a 77 y.o. male seen for follow up of Acute on Chronic Respiratory Failure.  Patient right now is on pressure support mode has been comfortable without distress.  Currently is on 40% oxygen.  Patient is on pressure support 12/5 the goal is for about 18 hours to wean is still awaiting tracheostomy  Medications: Reviewed on Rounds  Physical Exam:  Vitals: Temperature 98.6 pulse 88 respiratory rate 18 blood pressure 99/53 saturations 98%  Ventilator Settings mode of ventilation pressure support FiO2 40% pressure support 12/5  . General: Comfortable at this time . Eyes: Grossly normal lids, irises & conjunctiva . ENT: grossly tongue is normal . Neck: no obvious mass . Cardiovascular: S1 S2 normal no gallop . Respiratory: No rhonchi expansion is equal . Abdomen: soft . Skin: no rash seen on limited exam . Musculoskeletal: not rigid . Psychiatric:unable to assess . Neurologic: no seizure no involuntary movements         Labs on Admission:  Basic Metabolic Panel: Recent Labs  Lab 09/06/17 0838 09/07/17 0819  NA 135  --   K 5.4* 4.6  CL 95*  --   CO2 32  --   GLUCOSE 245*  --   BUN 28*  --   CREATININE 0.61  --   CALCIUM 9.1  --     Liver Function Tests: No results for input(s): AST, ALT, ALKPHOS, BILITOT, PROT, ALBUMIN in the last 168 hours. No results for input(s): LIPASE, AMYLASE in the last 168 hours. No results for input(s): AMMONIA in the last 168 hours.  CBC: Recent Labs  Lab 09/04/17 0520 09/06/17 0838  WBC 12.7* 12.9*  HGB 10.3* 11.3*  HCT 34.3* 36.6*  MCV 89.3 87.8  PLT 269 404*    Cardiac Enzymes: No results for input(s): CKTOTAL, CKMB, CKMBINDEX, TROPONINI in the last 168 hours.  BNP (last  3 results) No results for input(s): BNP in the last 8760 hours.  ProBNP (last 3 results) No results for input(s): PROBNP in the last 8760 hours.  Radiological Exams on Admission: Dg Abd 1 View  Result Date: 09/07/2017 CLINICAL DATA:  Orogastric tube placement EXAM: ABDOMEN - 1 VIEW COMPARISON:  08/31/2017 FINDINGS: The tip of the orogastric tube is in the body of the stomach. Gas-filled loops of small and large bowel are scattered across the abdomen and a mild ileus pattern. There is no obvious free intraperitoneal gas. IMPRESSION: NG tube tip is in the body of the stomach. Mild ileus pattern. Electronically Signed   By: Marybelle Killings M.D.   On: 09/07/2017 19:35   Dg Chest Port 1 View  Result Date: 09/06/2017 CLINICAL DATA:  Respiratory failure, intubated patient, sepsis, COPD, coronary artery disease. EXAM: PORTABLE CHEST 1 VIEW COMPARISON:  Portable chest x-ray of Aug 31, 2017 FINDINGS: The lungs are reasonably well inflated. Bibasilar densities persist greatest on the left. There is no pneumothorax. The cardiac silhouette is enlarged. The pulmonary vascularity is not clearly engorged. The interstitial markings of both lungs have improved. There is an endotracheal tube present whose tip projects approximately 6 cm above the carina. The esophagogastric tube tip in proximal port project below the GE junction. The right internal jugular venous catheter tip projects at the junction of the right and  left brachiocephalic veins. IMPRESSION: Mild improvement in the appearance of the pulmonary interstitium bilaterally may reflect resolving pulmonary edema. Decreased pleural effusions. Persistent left basilar atelectasis or pneumonia and likely minimal right basilar atelectasis. No large pleural effusion. The support tubes are in reasonable position. Electronically Signed   By: David  Martinique M.D.   On: 09/06/2017 07:44    Assessment/Plan Principal Problem:   Acute on chronic respiratory failure with  hypoxemia (HCC) Active Problems:   Chronic atrial fibrillation (HCC)   COPD (chronic obstructive pulmonary disease) (HCC)   Sepsis due to methicillin resistant Staphylococcus aureus (MRSA) (HCC)   Healthcare-associated pneumonia   1. Acute on chronic respiratory failure with hypoxia we will continue with weaning on pressure support mode as mentioned the goal is 18 hours.  We will continue secretion management pulmonary toilet.  Ultimately still needs to have a tracheostomy done. 2. Chronic atrial fibrillation rate is controlled we will continue present management. 3. COPD severe disease we will continue with present therapy prognosis guarded 4. Sepsis clinically hemodynamically stable we will monitor. 5. Healthcare associated pneumonia treated with antibiotics last x-ray showed some improvement   I have personally seen and evaluated the patient, evaluated laboratory and imaging results, formulated the assessment and plan and placed orders. The Patient requires high complexity decision making for assessment and support.  Case was discussed on Rounds with the Respiratory Therapy Staff  Allyne Gee, MD [facility] Pulmonary Critical Care Medicine Sleep Medicine

## 2017-09-10 DIAGNOSIS — I482 Chronic atrial fibrillation: Secondary | ICD-10-CM | POA: Diagnosis not present

## 2017-09-10 DIAGNOSIS — J9621 Acute and chronic respiratory failure with hypoxia: Secondary | ICD-10-CM | POA: Diagnosis not present

## 2017-09-10 DIAGNOSIS — J449 Chronic obstructive pulmonary disease, unspecified: Secondary | ICD-10-CM | POA: Diagnosis not present

## 2017-09-10 DIAGNOSIS — J189 Pneumonia, unspecified organism: Secondary | ICD-10-CM | POA: Diagnosis not present

## 2017-09-10 NOTE — Progress Notes (Signed)
Pulmonary [place]   PULMONARY SERVICE  PROGRESS NOTE  Date of Service: 09/10/2017  Dory Verdun Duvall  BLT:[id number]  DOB: June 03, 1940   DOA: 08/31/2017  Referring Physician: Merton Border, MD  HPI: EXAVIER LINA is a 77 y.o. male seen for follow up of Acute on Chronic Respiratory Failure.  Patient remains on pressure support mode right now on 40% oxygen.  Comfortable without distress at this time.  Still awaiting tracheostomy  Medications: Reviewed on Rounds  Physical Exam:  Vitals: Currently temperature 97.6 pulse 86 respiratory rate 26 blood pressure 126/70 saturations 94%  Ventilator Settings mode of ventilation pressure support FiO2 40% tidal volume 498cc pressure support 12 PEEP 5  . General: Comfortable at this time . Eyes: Grossly normal lids, irises & conjunctiva . ENT: grossly tongue is normal . Neck: no obvious mass . Cardiovascular: S1 S2 normal no gallop . Respiratory: Scattered rhonchi noted bilaterally . Abdomen: soft . Skin: no rash seen on limited exam . Musculoskeletal: not rigid . Psychiatric:unable to assess . Neurologic: no seizure no involuntary movements         Labs on Admission:  Basic Metabolic Panel: Recent Labs  Lab 09/06/17 0838 09/07/17 0819  NA 135  --   K 5.4* 4.6  CL 95*  --   CO2 32  --   GLUCOSE 245*  --   BUN 28*  --   CREATININE 0.61  --   CALCIUM 9.1  --     Liver Function Tests: No results for input(s): AST, ALT, ALKPHOS, BILITOT, PROT, ALBUMIN in the last 168 hours. No results for input(s): LIPASE, AMYLASE in the last 168 hours. No results for input(s): AMMONIA in the last 168 hours.  CBC: Recent Labs  Lab 09/04/17 0520 09/06/17 0838  WBC 12.7* 12.9*  HGB 10.3* 11.3*  HCT 34.3* 36.6*  MCV 89.3 87.8  PLT 269 404*    Cardiac Enzymes: No results for input(s): CKTOTAL, CKMB, CKMBINDEX, TROPONINI in the last 168 hours.  BNP (last 3 results) No results for input(s): BNP  in the last 8760 hours.  ProBNP (last 3 results) No results for input(s): PROBNP in the last 8760 hours.  Radiological Exams on Admission: Dg Abd 1 View  Result Date: 09/07/2017 CLINICAL DATA:  Orogastric tube placement EXAM: ABDOMEN - 1 VIEW COMPARISON:  08/31/2017 FINDINGS: The tip of the orogastric tube is in the body of the stomach. Gas-filled loops of small and large bowel are scattered across the abdomen and a mild ileus pattern. There is no obvious free intraperitoneal gas. IMPRESSION: NG tube tip is in the body of the stomach. Mild ileus pattern. Electronically Signed   By: Marybelle Killings M.D.   On: 09/07/2017 19:35    Assessment/Plan Principal Problem:   Acute on chronic respiratory failure with hypoxemia (HCC) Active Problems:   Chronic atrial fibrillation (HCC)   COPD (chronic obstructive pulmonary disease) (HCC)   Sepsis due to methicillin resistant Staphylococcus aureus (MRSA) (HCC)   Healthcare-associated pneumonia   1. Acute on chronic respiratory failure with hypoxia we will continue with weaning on pressure support continue pulmonary toilet secretion management.  Patient overall is tolerating the pressure support well however not a candidate for extubation secondary to prior complications. 2. Chronic atrial fibrillation rate controlled right now we will continue to monitor 3. COPD severe disease we will continue with present therapy 4. Sepsis secondary to MRSA treated we will follow 5. Healthcare associated pneumonia as above treated  I have personally seen and evaluated the patient, evaluated laboratory and imaging results, formulated the assessment and plan and placed orders. The Patient requires high complexity decision making for assessment and support.  Case was discussed on Rounds with the Respiratory Therapy Staff  Allyne Gee, MD [facility] Pulmonary Critical Care Medicine Sleep Medicine

## 2017-09-11 DIAGNOSIS — J449 Chronic obstructive pulmonary disease, unspecified: Secondary | ICD-10-CM | POA: Diagnosis not present

## 2017-09-11 DIAGNOSIS — I482 Chronic atrial fibrillation: Secondary | ICD-10-CM | POA: Diagnosis not present

## 2017-09-11 DIAGNOSIS — J9621 Acute and chronic respiratory failure with hypoxia: Secondary | ICD-10-CM | POA: Diagnosis not present

## 2017-09-11 DIAGNOSIS — J189 Pneumonia, unspecified organism: Secondary | ICD-10-CM | POA: Diagnosis not present

## 2017-09-11 DIAGNOSIS — A4102 Sepsis due to Methicillin resistant Staphylococcus aureus: Secondary | ICD-10-CM | POA: Diagnosis not present

## 2017-09-11 NOTE — Progress Notes (Signed)
Pulmonary [place]   PULMONARY SERVICE  PROGRESS NOTE  Date of Service: 09/11/2017  ELVAN EBRON  GLO:[id number]  DOB: 1941-01-02   DOA: 08/31/2017  Referring Physician: Merton Border, MD  HPI: Dwayne Griffin is a 77 y.o. male seen for follow up of Acute on Chronic Respiratory Failure.  Patient is comfortable without distress at this time has been on pressure support mode tracheostomy scheduled for morning  Medications: Reviewed on Rounds  Physical Exam:  Vitals: Temperature 97.2 pulse 98 respiratory rate 16 blood pressure 109/51 saturations 97%  Ventilator Settings mode of ventilation pressure support FiO2 40% tidal volume 569cc pressure 12 PEEP 5  . General: Comfortable at this time . Eyes: Grossly normal lids, irises & conjunctiva . ENT: grossly tongue is normal . Neck: no obvious mass . Cardiovascular: S1 S2 normal no gallop . Respiratory: Good air entry no rhonchi expansion is equal . Abdomen: soft . Skin: no rash seen on limited exam . Musculoskeletal: not rigid . Psychiatric:unable to assess . Neurologic: no seizure no involuntary movements         Labs on Admission:  Basic Metabolic Panel: Recent Labs  Lab 09/06/17 0838 09/07/17 0819  NA 135  --   K 5.4* 4.6  CL 95*  --   CO2 32  --   GLUCOSE 245*  --   BUN 28*  --   CREATININE 0.61  --   CALCIUM 9.1  --     Liver Function Tests: No results for input(s): AST, ALT, ALKPHOS, BILITOT, PROT, ALBUMIN in the last 168 hours. No results for input(s): LIPASE, AMYLASE in the last 168 hours. No results for input(s): AMMONIA in the last 168 hours.  CBC: Recent Labs  Lab 09/06/17 0838  WBC 12.9*  HGB 11.3*  HCT 36.6*  MCV 87.8  PLT 404*    Cardiac Enzymes: No results for input(s): CKTOTAL, CKMB, CKMBINDEX, TROPONINI in the last 168 hours.  BNP (last 3 results) No results for input(s): BNP in the last 8760 hours.  ProBNP (last 3 results) No results for  input(s): PROBNP in the last 8760 hours.  Radiological Exams on Admission: Dg Abd 1 View  Result Date: 09/07/2017 CLINICAL DATA:  Orogastric tube placement EXAM: ABDOMEN - 1 VIEW COMPARISON:  08/31/2017 FINDINGS: The tip of the orogastric tube is in the body of the stomach. Gas-filled loops of small and large bowel are scattered across the abdomen and a mild ileus pattern. There is no obvious free intraperitoneal gas. IMPRESSION: NG tube tip is in the body of the stomach. Mild ileus pattern. Electronically Signed   By: Marybelle Killings M.D.   On: 09/07/2017 19:35    Assessment/Plan Principal Problem:   Acute on chronic respiratory failure with hypoxemia (HCC) Active Problems:   Chronic atrial fibrillation (HCC)   COPD (chronic obstructive pulmonary disease) (HCC)   Sepsis due to methicillin resistant Staphylococcus aureus (MRSA) (HCC)   Healthcare-associated pneumonia   1. Acute on chronic respiratory failure with hypoxia we will continue pressure support mode continue pulmonary toilet secretion management.  Titrate oxygen down as tolerated hopefully after the trach is done we can move aggressively on weaning him. 2. Chronic atrial fibrillation rate is controlled we will continue to follow 3. COPD advanced disease we will continue present management. 4. Sepsis due to MRSA treated we will continue supportive care 5. Healthcare associated pneumonia as above   I have personally seen and evaluated the patient, evaluated laboratory and  imaging results, formulated the assessment and plan and placed orders. The Patient requires high complexity decision making for assessment and support.  Case was discussed on Rounds with the Respiratory Therapy Staff  Allyne Gee, MD [facility] Pulmonary Critical Care Medicine Sleep Medicine

## 2017-09-12 ENCOUNTER — Institutional Professional Consult (permissible substitution) (HOSPITAL_COMMUNITY): Payer: Medicare Other | Admitting: Anesthesiology

## 2017-09-12 ENCOUNTER — Encounter (HOSPITAL_COMMUNITY): Payer: Medicare Other | Admitting: Anesthesiology

## 2017-09-12 ENCOUNTER — Other Ambulatory Visit (HOSPITAL_COMMUNITY): Payer: Self-pay

## 2017-09-12 ENCOUNTER — Encounter (HOSPITAL_COMMUNITY)
Admission: RE | Disposition: A | Discharge status: Nursing Home (Includes ICF) | Payer: Self-pay | Source: Ambulatory Visit | Attending: Internal Medicine

## 2017-09-12 ENCOUNTER — Inpatient Hospital Stay
Admission: AD | Admit: 2017-09-12 | Discharge: 2017-11-10 | Disposition: E | Payer: Medicare Other | Source: Ambulatory Visit | Attending: Internal Medicine | Admitting: Internal Medicine

## 2017-09-12 DIAGNOSIS — J9690 Respiratory failure, unspecified, unspecified whether with hypoxia or hypercapnia: Secondary | ICD-10-CM

## 2017-09-12 DIAGNOSIS — J189 Pneumonia, unspecified organism: Secondary | ICD-10-CM | POA: Diagnosis present

## 2017-09-12 DIAGNOSIS — R1310 Dysphagia, unspecified: Secondary | ICD-10-CM

## 2017-09-12 DIAGNOSIS — I482 Chronic atrial fibrillation, unspecified: Secondary | ICD-10-CM | POA: Diagnosis present

## 2017-09-12 DIAGNOSIS — K5900 Constipation, unspecified: Secondary | ICD-10-CM

## 2017-09-12 DIAGNOSIS — J449 Chronic obstructive pulmonary disease, unspecified: Secondary | ICD-10-CM | POA: Diagnosis present

## 2017-09-12 DIAGNOSIS — K56609 Unspecified intestinal obstruction, unspecified as to partial versus complete obstruction: Secondary | ICD-10-CM

## 2017-09-12 DIAGNOSIS — Z4659 Encounter for fitting and adjustment of other gastrointestinal appliance and device: Secondary | ICD-10-CM

## 2017-09-12 DIAGNOSIS — J9621 Acute and chronic respiratory failure with hypoxia: Secondary | ICD-10-CM | POA: Diagnosis present

## 2017-09-12 DIAGNOSIS — A4102 Sepsis due to Methicillin resistant Staphylococcus aureus: Secondary | ICD-10-CM | POA: Diagnosis not present

## 2017-09-12 DIAGNOSIS — Z931 Gastrostomy status: Secondary | ICD-10-CM

## 2017-09-12 HISTORY — PX: TRACHEOSTOMY TUBE PLACEMENT: SHX814

## 2017-09-12 SURGERY — CREATION, TRACHEOSTOMY
Anesthesia: General

## 2017-09-12 SURGERY — CREATION, TRACHEOSTOMY
Anesthesia: General | Site: Neck

## 2017-09-12 MED ORDER — LACTATED RINGERS IV SOLN
INTRAVENOUS | Status: DC | PRN
  Administered 2017-09-12: 10:00:00 via INTRAVENOUS

## 2017-09-12 MED ORDER — MIDAZOLAM HCL 2 MG/2ML IJ SOLN
INTRAMUSCULAR | Status: AC
  Filled 2017-09-12: qty 2

## 2017-09-12 MED ORDER — PROPOFOL 10 MG/ML IV BOLUS
INTRAVENOUS | Status: AC
  Filled 2017-09-12: qty 20

## 2017-09-12 MED ORDER — LIDOCAINE-EPINEPHRINE (PF) 1 %-1:200000 IJ SOLN
INTRAMUSCULAR | Status: AC
  Filled 2017-09-12: qty 30

## 2017-09-12 MED ORDER — ROCURONIUM BROMIDE 100 MG/10ML IV SOLN
INTRAVENOUS | Status: DC | PRN
  Administered 2017-09-12: 30 mg via INTRAVENOUS

## 2017-09-12 MED ORDER — LIDOCAINE-EPINEPHRINE 1 %-1:100000 IJ SOLN
INTRAMUSCULAR | Status: DC | PRN
  Administered 2017-09-12: 30 mL

## 2017-09-12 MED ORDER — FENTANYL CITRATE (PF) 250 MCG/5ML IJ SOLN
INTRAMUSCULAR | Status: AC
  Filled 2017-09-12: qty 5

## 2017-09-12 MED ORDER — 0.9 % SODIUM CHLORIDE (POUR BTL) OPTIME
TOPICAL | Status: DC | PRN
  Administered 2017-09-12: 1000 mL

## 2017-09-12 MED ORDER — MIDAZOLAM HCL 5 MG/5ML IJ SOLN
INTRAMUSCULAR | Status: DC | PRN
  Administered 2017-09-12: 2 mg via INTRAVENOUS

## 2017-09-12 SURGICAL SUPPLY — 39 items
ATTRACTOMAT 16X20 MAGNETIC DRP (DRAPES) IMPLANT
BLADE SURG 15 STRL LF DISP TIS (BLADE) ×1 IMPLANT
BLADE SURG 15 STRL SS (BLADE) ×2
CLEANER TIP ELECTROSURG 2X2 (MISCELLANEOUS) ×3 IMPLANT
COVER SURGICAL LIGHT HANDLE (MISCELLANEOUS) ×3 IMPLANT
DRAPE HALF SHEET 40X57 (DRAPES) IMPLANT
ELECT COATED BLADE 2.86 ST (ELECTRODE) ×3 IMPLANT
ELECT REM PT RETURN 9FT ADLT (ELECTROSURGICAL) ×3
ELECTRODE REM PT RTRN 9FT ADLT (ELECTROSURGICAL) ×1 IMPLANT
GAUZE SPONGE 4X4 16PLY XRAY LF (GAUZE/BANDAGES/DRESSINGS) ×3 IMPLANT
GEL ULTRASOUND 20GR AQUASONIC (MISCELLANEOUS) ×3 IMPLANT
GLOVE SS BIOGEL STRL SZ 7.5 (GLOVE) ×1 IMPLANT
GLOVE SUPERSENSE BIOGEL SZ 7.5 (GLOVE) ×2
GOWN STRL REUS W/ TWL LRG LVL3 (GOWN DISPOSABLE) ×1 IMPLANT
GOWN STRL REUS W/ TWL XL LVL3 (GOWN DISPOSABLE) ×1 IMPLANT
GOWN STRL REUS W/TWL LRG LVL3 (GOWN DISPOSABLE) ×2
GOWN STRL REUS W/TWL XL LVL3 (GOWN DISPOSABLE) ×2
HOLDER TRACH TUBE VELCRO 19.5 (MISCELLANEOUS) ×3 IMPLANT
KIT BASIN OR (CUSTOM PROCEDURE TRAY) ×3 IMPLANT
KIT SUCTION CATH 14FR (SUCTIONS) ×3 IMPLANT
KIT TURNOVER KIT B (KITS) ×3 IMPLANT
NEEDLE HYPO 25GX1X1/2 BEV (NEEDLE) ×3 IMPLANT
NS IRRIG 1000ML POUR BTL (IV SOLUTION) ×3 IMPLANT
PACK EENT II TURBAN DRAPE (CUSTOM PROCEDURE TRAY) ×3 IMPLANT
PAD ARMBOARD 7.5X6 YLW CONV (MISCELLANEOUS) IMPLANT
PENCIL BUTTON HOLSTER BLD 10FT (ELECTRODE) ×3 IMPLANT
SPLINT NASAL DOYLE BI-VL (GAUZE/BANDAGES/DRESSINGS) ×3 IMPLANT
SPONGE DRAIN TRACH 4X4 STRL 2S (GAUZE/BANDAGES/DRESSINGS) ×3 IMPLANT
SPONGE INTESTINAL PEANUT (DISPOSABLE) ×3 IMPLANT
SUT SILK 2 0 SH CR/8 (SUTURE) ×3 IMPLANT
SUT SILK 3 0 TIES 10X30 (SUTURE) IMPLANT
SYR 5ML LUER SLIP (SYRINGE) ×3 IMPLANT
SYR CONTROL 10ML LL (SYRINGE) ×3 IMPLANT
TOWEL OR 17X24 6PK STRL BLUE (TOWEL DISPOSABLE) ×3 IMPLANT
TOWEL OR 17X26 10 PK STRL BLUE (TOWEL DISPOSABLE) ×3 IMPLANT
TUBE CONNECTING 12'X1/4 (SUCTIONS) ×1
TUBE CONNECTING 12X1/4 (SUCTIONS) ×2 IMPLANT
TUBE TRACH SHILEY  6 DIST  CUF (TUBING) ×3 IMPLANT
TUBE TRACH SHILEY 8 DIST CUF (TUBING) IMPLANT

## 2017-09-12 NOTE — Brief Op Note (Signed)
09/26/2017  11:27 AM  PATIENT:  Dwayne Griffin  77 y.o. male  PRE-OPERATIVE DIAGNOSIS:  resp failure  POST-OPERATIVE DIAGNOSIS:  resp failure  PROCEDURE:  Procedure(s): TRACHEOSTOMY (N/A)  # 6 Shiley  SURGEON:  Surgeon(s) and Role:    Rozetta Nunnery, MD - Primary  PHYSICIAN ASSISTANT:   ASSISTANTS: none   ANESTHESIA:   general  EBL:  5 mL   BLOOD ADMINISTERED:none  DRAINS: none   LOCAL MEDICATIONS USED:  XYLOCAINE with EPI 6 cc  SPECIMEN:  No Specimen  DISPOSITION OF SPECIMEN:  N/A  COUNTS:  YES  TOURNIQUET:  * No tourniquets in log *  DICTATION: .Other Dictation: Dictation Number [id number]  PLAN OF CARE: Admit to inpatient   PATIENT DISPOSITION:  PACU - hemodynamically stable.   Delay start of Pharmacological VTE agent (>24hrs) due to surgical blood loss or risk of bleeding: yes

## 2017-09-12 NOTE — Op Note (Signed)
NAME: Dwayne Griffin, [place] MEDICAL RECORD AV:[id number] ACCOUNT [id number] DATE OF BIRTH:Feb 02, 1941 FACILITY: [facility] LOCATION: [facility] PHYSICIAN:Ruven Corradi Lincoln Maxin, MD  OPERATIVE REPORT  DATE OF PROCEDURE:  09/11/2017  PREOPERATIVE DIAGNOSIS:  Acute on chronic respiratory failure.  POSTOPERATIVE DIAGNOSIS:  Acute on chronic respiratory failure.  OPERATION PERFORMED:  Tracheotomy with a #6 Shiley cuffed tube.  SURGEON:  Melony Overly, MD.  ANESTHESIA:  General endotracheal.  COMPLICATIONS:  None.  ESTIMATED BLOOD LOSS:  Minimal.  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  Postoperatively, he developed respiratory failure as well as sepsis and pneumonia requiring intubation.  Attempts to extubating  were unsuccessful and the patient was subsequently transferred to [facility] 10 days ago intubated on ventilator.  Attempts at weaning him had been unsuccessful and a tracheotomy was recommended.  The patient has previous history of  coronary artery disease, congestive heart failure, COPD and is status post a right lobectomy for lung cancer.  He also has AFib and is on heparin.  He was taken to the operating room at this time for a tracheotomy.  DESCRIPTION OF PROCEDURE:  The patient was brought down to the OR from [facility].  He remained in his bed.  A roll was placed underneath his shoulders to extend his neck.  The proposed incision site was marked out and injected with 5 to 6  mL of Xylocaine with epinephrine.  The area was then prepped with Betadine solution and draped over with sterile towels.  A vertical incision was made just above the suprasternal notch.  Dissection was carried down through the subcutaneous tissue with  cautery for hemostasis.  The strap muscles were identified and divided in the midline longitudinally and retracted laterally.  The thyroid isthmus  was overlying the first and second tracheal rings.  This was divided with a cautery to expose the first 3  tracheal rings.  A cricoid hook was used to suspend the trachea and a horizontal tracheotomy was made between the first and second tracheal rings.  The endotracheal tube was removed and a #6 Shiley tube was inserted via the tracheotomy site without any  difficulty.  The patient was ventilated well.  This was secured to the neck with four 2-0 silk sutures and Velcro trach collar around the neck.  The patient had previously had an orogastric tube along with his endotracheal tube.  This was removed and a  nasogastric tube was passed into the stomach by CRNA.  The patient was subsequently transferred back to [facility] postoperatively doing well.  TN/NUANCE  D:09/16/2017 T:09/30/2017 JOB:[id number]

## 2017-09-12 NOTE — Anesthesia Procedure Notes (Signed)
Date/Time: 09/19/2017 10:30 AM Performed by: Izora Gala, CRNA Pre-anesthesia Checklist: Patient identified, Emergency Drugs available, Suction available and Patient being monitored Oxygen Delivery Method: Circle system utilized and Ambu bag Preoxygenation: Pre-oxygenation with 100% oxygen (Patient transferred to OR with O2 @ 10 l/m then placed on anesthesia machine.) Induction Type: Inhalational induction Placement Confirmation: positive ETCO2 and breath sounds checked- equal and bilateral

## 2017-09-12 NOTE — Progress Notes (Signed)
Pulmonary [place]   PULMONARY SERVICE  PROGRESS NOTE  Date of Service: 09/25/2017  Dwayne Griffin  KGU:[id number]  DOB: [birth year]   DOA: 09/14/2017  Referring Physician: Merton Border, MD  HPI: Dwayne Griffin is a 77 y.o. male seen for follow up of Acute on Chronic Respiratory Failure.  Patient had tracheostomy done today is on the ventilator resting.  Patient's wife was present in the room and she was updated.  The tracheostomy went well no complications noted.  Medications: Reviewed on Rounds  Physical Exam:  Vitals: temp 98.5 pulse 100 respiratory rate 18 blood pressure 106/64 saturations 96%  Ventilator Settings mode of ventilation assist control FiO2 40% tidal volume 515cc PEEP 5  . General: Comfortable at this time . Eyes: Grossly normal lids, irises & conjunctiva . ENT: grossly tongue is normal . Neck: no obvious mass . Cardiovascular: S1 S2 normal no gallop . Respiratory: No rhonchi expansion is equal . Abdomen: soft . Skin: no rash seen on limited exam . Musculoskeletal: not rigid . Psychiatric:unable to assess . Neurologic: no seizure no involuntary movements         Lab Data:   Basic Metabolic Panel: Recent Labs  Lab 09/06/17 0838 09/07/17 0819  NA 135  --   K 5.4* 4.6  CL 95*  --   CO2 32  --   GLUCOSE 245*  --   BUN 28*  --   CREATININE 0.61  --   CALCIUM 9.1  --     Liver Function Tests: No results for input(s): AST, ALT, ALKPHOS, BILITOT, PROT, ALBUMIN in the last 168 hours. No results for input(s): LIPASE, AMYLASE in the last 168 hours. No results for input(s): AMMONIA in the last 168 hours.  CBC: Recent Labs  Lab 09/06/17 0838  WBC 12.9*  HGB 11.3*  HCT 36.6*  MCV 87.8  PLT 404*    Cardiac Enzymes: No results for input(s): CKTOTAL, CKMB, CKMBINDEX, TROPONINI in the last 168 hours.  BNP (last 3 results) No results for input(s): BNP in the last 8760 hours.  ProBNP (last 3 results) No  results for input(s): PROBNP in the last 8760 hours.  Radiological Exams: Dg Abd Portable 1v  Result Date: 09/29/2017 CLINICAL DATA:  Status post nasogastric tube placement. EXAM: PORTABLE ABDOMEN - 1 VIEW COMPARISON:  KUB of Sep 07, 2017 FINDINGS: The nasogastric tube's proximal port lies at the GE junction with the tip in the cardia. Advancement is needed. IMPRESSION: Advancement of the nasogastric tube by approximately 10 cm would assure that the proximal port is positioned well below the GE junction. Electronically Signed   By: David  Martinique M.D.   On: 09/24/2017 14:25    Assessment/Plan Principal Problem:   Acute on chronic respiratory failure with hypoxemia (HCC) Active Problems:   Chronic atrial fibrillation (HCC)   COPD (chronic obstructive pulmonary disease) (HCC)   Sepsis due to methicillin resistant Staphylococcus aureus (MRSA) (HCC)   Healthcare-associated pneumonia   1. Acute on chronic respiratory failure with hypoxia patient will continue with full vent support overnight since the tracheostomy is fresh.  We will reassess the weaning in the morning and hopefully be able to start him back on pressure support.  Patient's wife was updated 2. Chronic atrial fibrillation rate is controlled we will continue supportive care 3. COPD advanced disease we will follow 4. Sepsis treated hemodynamically stable 5. Healthcare associated pneumonia treated hemodynamically stable we will continue to monitor   I have  personally seen and evaluated the patient, evaluated laboratory and imaging results, formulated the assessment and plan and placed orders. The Patient requires high complexity decision making for assessment and support.  Case was discussed on Rounds with the Respiratory Therapy Staff  Allyne Gee, MD [facility] Pulmonary Critical Care Medicine Sleep Medicine

## 2017-09-12 NOTE — Interval H&P Note (Signed)
History and Physical Interval Note:  09/14/2017 10:01 AM  Dwayne Griffin  has presented today for surgery, with the diagnosis of Respiratory failure  The various methods of treatment have been discussed with the patient and family. After consideration of risks, benefits and other options for treatment, the patient has consented to  Procedure(s): TRACHEOSTOMY (N/A) as a surgical intervention .  The patient's history has been reviewed, patient examined, no change in status, stable for surgery.  I have reviewed the patient's chart and labs.  Questions were answered to the patient's satisfaction.     Melony Overly

## 2017-09-12 NOTE — Interval H&P Note (Signed)
History and Physical Interval Note:  09/20/2017 10:01 AM  Dwayne Griffin  has presented today for surgery, with the diagnosis of Respiratory failure  The various methods of treatment have been discussed with the patient and family. After consideration of risks, benefits and other options for treatment, the patient has consented to  Procedure(s): TRACHEOSTOMY (N/A) as a surgical intervention .  The patient's history has been reviewed, patient examined, no change in status, stable for surgery.  I have reviewed the patient's chart and labs.  Questions were answered to the patient's satisfaction.     Melony Overly

## 2017-09-12 NOTE — Transfer of Care (Signed)
Immediate Anesthesia Transfer of Care Note  Patient: Dwayne Griffin  Procedure(s) Performed: TRACHEOSTOMY (N/A Neck)  Patient Location: Nursing Unit  Anesthesia Type:General  Level of Consciousness: drowsy and patient cooperative  Airway & Oxygen Therapy: Patient placed on Ventilator (see vital sign flow sheet for setting)  Post-op Assessment: Report given to RN and Post -op Vital signs reviewed and stable  Post vital signs: Reviewed and stable  Last Vitals:  Vitals Value Taken Time  BP    Temp    Pulse    Resp    SpO2      Last Pain: There were no vitals filed for this visit.       Complications: No apparent anesthesia complications

## 2017-09-12 NOTE — H&P (Signed)
PREOPERATIVE H&P  Chief Complaint: Acute on chronic respiratory failure  HPI: Dwayne Griffin is a 77 y.o. male who presents for evaluation of respiratory failure for tracheostomy. 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 This was complicated by pneumonia and respiratory failure post op requiring intubation. Attempts to extubate patient were unsuccessful and patient was transferred to [facility] on 5/22 intubated and on vent. Patient also with history of CHF, chronic A fib, CAD with 34% ejection fraction, COPD s/p right lobectomy for lung ca, and DM.  Past Medical History:  Diagnosis Date  . Acute on chronic respiratory failure with hypoxemia ([place])   . Chronic atrial fibrillation ([place])   . COPD (chronic obstructive pulmonary disease) ([place])   . Coronary artery disease   . Dry gangrene ([place])   . Healthcare-associated pneumonia 09/01/2017  . Lung cancer ([place])   . Sepsis due to methicillin resistant Staphylococcus aureus (MRSA) ([place]) 09/01/2017  . Supraventricular tachycardia [facility])    Past Surgical History:  Procedure Laterality Date  . Amputation left lower extremity    . Central line placement    . Right upper lobectomy     Social History   Socioeconomic History  . Marital status: Married    Spouse name: Not on file  . Number of children: Not on file  . Years of education: Not on file  . Highest education level: Not on file  Occupational History  . Not on file  Social Needs  . Financial resource strain: Not on file  . Food insecurity:    Worry: Not on file    Inability: Not on file  . Transportation needs:    Medical: Not on file    Non-medical: Not on file  Tobacco Use  . Smoking status: Former Research scientist (life sciences)  . Smokeless tobacco: Never Used  Substance and Sexual Activity  . Alcohol use: Not Currently  . Drug use: Not Currently  . Sexual activity: Not Currently  Lifestyle  . Physical activity:    Days per week: Not on file   Minutes per session: Not on file  . Stress: Not on file  Relationships  . Social connections:    Talks on phone: Not on file    Gets together: Not on file    Attends religious service: Not on file    Active member of club or organization: Not on file    Attends meetings of clubs or organizations: Not on file    Relationship status: Not on file  Other Topics Concern  . Not on file  Social History Narrative  . Not on file   Family History  Family history unknown: Yes   Allergies not on file Prior to Admission medications   Not on File     Positive ROS: neg  All other systems have been reviewed and were otherwise negative with the exception of those mentioned in the HPI and as above.  Physical Exam: There were no vitals filed for this visit.  General: Intubated and sedated. Discussed trach with wife. Neck: No palpable adenopathy or thyroid nodules, trach midline without mass Cardiovascular: Iregular rate and rhythm, no murmur.  Respiratory: Clear to auscultation   Assessment/Plan: Respiratory failure Plan for Procedure(s): TRACHEOSTOMY   Melony Overly, MD 09/16/2017 9:51 AM

## 2017-09-13 ENCOUNTER — Encounter (HOSPITAL_COMMUNITY): Payer: Self-pay | Admitting: Otolaryngology

## 2017-09-14 ENCOUNTER — Encounter: Payer: Self-pay | Admitting: Internal Medicine

## 2017-09-14 DIAGNOSIS — I482 Chronic atrial fibrillation: Secondary | ICD-10-CM | POA: Diagnosis not present

## 2017-09-14 DIAGNOSIS — J189 Pneumonia, unspecified organism: Secondary | ICD-10-CM | POA: Diagnosis not present

## 2017-09-14 DIAGNOSIS — J9621 Acute and chronic respiratory failure with hypoxia: Secondary | ICD-10-CM | POA: Diagnosis not present

## 2017-09-14 DIAGNOSIS — J449 Chronic obstructive pulmonary disease, unspecified: Secondary | ICD-10-CM | POA: Diagnosis not present

## 2017-09-14 NOTE — Anesthesia Postprocedure Evaluation (Signed)
Anesthesia Post Note  Patient: Dwayne Griffin  Procedure(s) Performed: TRACHEOSTOMY (N/A Neck)     Patient location during evaluation: Other [facility]) Anesthesia Type: General Level of consciousness: sedated Pain management: pain level controlled Vital Signs Assessment: post-procedure vital signs reviewed and stable Respiratory status: patient remains intubated per anesthesia plan and patient on ventilator - see flowsheet for VS Cardiovascular status: stable Postop Assessment: no apparent nausea or vomiting Anesthetic complications: no    Last Vitals: There were no vitals filed for this visit.  Last Pain: There were no vitals filed for this visit.               Derion Kreiter COKER

## 2017-09-14 NOTE — Progress Notes (Addendum)
Pulmonary [place]   PULMONARY SERVICE  PROGRESS NOTE  Date of Service: 09/14/2017  Dwayne Griffin  MCE:[id number]  DOB: Dec 22, 1940   DOA: 09/11/2017  Referring Physician: Merton Border, MD  HPI: Dwayne Griffin is a 77 y.o. male seen for follow up of Acute on Chronic Respiratory Failure.  Comfortable without distress at this time.  Patient's on pressure support mode and has been on 45% oxygen with good volumes.  The goal is to wean for about 6 to 8 hours  Medications: Reviewed on Rounds  Physical Exam:  Vitals: Temperature 98.0 pulse 115 respiratory rate 21 blood pressure 103/70 saturations 92%  Ventilator Settings mode of ventilation pressure support FiO2 45% tidal volume 533 pressure support 12 PEEP 5  . General: Comfortable at this time . Eyes: Grossly normal lids, irises & conjunctiva . ENT: grossly tongue is normal . Neck: no obvious mass . Cardiovascular: S1 S2 normal no gallop . Respiratory: No rhonchi expansion is equal . Abdomen: soft . Skin: no rash seen on limited exam . Musculoskeletal: not rigid . Psychiatric:unable to assess . Neurologic: no seizure no involuntary movements         Lab Data:   Basic Metabolic Panel: No results for input(s): NA, K, CL, CO2, GLUCOSE, BUN, CREATININE, CALCIUM, MG, PHOS in the last 168 hours.  Liver Function Tests: No results for input(s): AST, ALT, ALKPHOS, BILITOT, PROT, ALBUMIN in the last 168 hours. No results for input(s): LIPASE, AMYLASE in the last 168 hours. No results for input(s): AMMONIA in the last 168 hours.  CBC: No results for input(s): WBC, NEUTROABS, HGB, HCT, MCV, PLT in the last 168 hours.  Cardiac Enzymes: No results for input(s): CKTOTAL, CKMB, CKMBINDEX, TROPONINI in the last 168 hours.  BNP (last 3 results) No results for input(s): BNP in the last 8760 hours.  ProBNP (last 3 results) No results for input(s): PROBNP in the last 8760 hours.  Radiological  Exams: No results found.  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 Acute on chronic respiratory failure with hypoxia we will continue to wean on pressure support mode hopefully will be able to start on T collar trials soon continue with care 2. Chronic atrial fibrillation rate is controlled we will follow monitor. 3. COPD severe disease we will continue with present management. 4. Sepsis treated 5. Healthcare associated pneumonia treated appears to be hemodynamically stable   I have personally seen and evaluated the patient, evaluated laboratory and imaging results, formulated the assessment and plan and placed orders. The Patient requires high complexity decision making for assessment and support.  Case was discussed on Rounds with the Respiratory Therapy Staff  Allyne Gee, MD [facility] Pulmonary Critical Care Medicine Sleep Medicine

## 2017-09-14 NOTE — Anesthesia Preprocedure Evaluation (Signed)
Anesthesia Evaluation  Patient identified by MRN, date of birth, ID band Patient unresponsive    Reviewed: Allergy & Precautions, NPO status , Patient's Chart, lab work & pertinent test results  Airway Mallampati: Intubated       Dental   Pulmonary former smoker,    + rhonchi        Cardiovascular  Rhythm:Regular Rate:Normal     Neuro/Psych    GI/Hepatic   Endo/Other    Renal/GU      Musculoskeletal   Abdominal   Peds  Hematology   Anesthesia Other Findings   Reproductive/Obstetrics                             Anesthesia Physical Anesthesia Plan  ASA: III  Anesthesia Plan:    Post-op Pain Management:    Induction: Intravenous  PONV Risk Score and Plan: Ondansetron and Dexamethasone  Airway Management Planned: Tracheostomy  Additional Equipment:   Intra-op Plan:   Post-operative Plan: Post-operative intubation/ventilation  Informed Consent: I have reviewed the patients History and Physical, chart, labs and discussed the procedure including the risks, benefits and alternatives for the proposed anesthesia with the patient or authorized representative who has indicated his/her understanding and acceptance.     Plan Discussed with: CRNA and Anesthesiologist  Anesthesia Plan Comments:         Anesthesia Quick Evaluation

## 2017-09-15 ENCOUNTER — Other Ambulatory Visit (HOSPITAL_COMMUNITY): Payer: Self-pay

## 2017-09-15 DIAGNOSIS — J189 Pneumonia, unspecified organism: Secondary | ICD-10-CM | POA: Diagnosis not present

## 2017-09-15 DIAGNOSIS — J449 Chronic obstructive pulmonary disease, unspecified: Secondary | ICD-10-CM | POA: Diagnosis not present

## 2017-09-15 DIAGNOSIS — I482 Chronic atrial fibrillation: Secondary | ICD-10-CM | POA: Diagnosis not present

## 2017-09-15 DIAGNOSIS — J9621 Acute and chronic respiratory failure with hypoxia: Secondary | ICD-10-CM | POA: Diagnosis not present

## 2017-09-15 LAB — BASIC METABOLIC PANEL
Anion gap: 9 (ref 5–15)
BUN: 41 mg/dL — ABNORMAL HIGH (ref 6–20)
CALCIUM: 9.1 mg/dL (ref 8.9–10.3)
CHLORIDE: 98 mmol/L — AB (ref 101–111)
CO2: 34 mmol/L — AB (ref 22–32)
Creatinine, Ser: 0.73 mg/dL (ref 0.61–1.24)
GFR calc non Af Amer: >60 mL/min (ref >60)
Glucose, Bld: 427 mg/dL — ABNORMAL HIGH (ref 65–99)
Potassium: 4.7 mmol/L (ref 3.5–5.1)
Sodium: 141 mmol/L (ref 135–145)

## 2017-09-15 LAB — BLOOD GAS, ARTERIAL
ACID-BASE EXCESS: 9.5 mmol/L — AB (ref 0.0–2.0)
Acid-Base Excess: 10.5 mmol/L — ABNORMAL HIGH (ref 0.0–2.0)
BICARBONATE: 35 mmol/L — AB (ref 20.0–28.0)
BICARBONATE: 35.8 mmol/L — AB (ref 20.0–28.0)
FIO2: 70
FIO2: 80
LHR: 14 {breaths}/min
LHR: 18 {breaths}/min
MECHVT: 500 mL
O2 SAT: 89.1 %
O2 Saturation: 99 %
PATIENT TEMPERATURE: 99.2
PCO2 ART: 61.1 mmHg — AB (ref 32.0–48.0)
PEEP/CPAP: 8 cmH2O
PEEP/CPAP: 8 cmH2O
PO2 ART: 161 mmHg — AB (ref 83.0–108.0)
PO2 ART: 61.9 mmHg — AB (ref 83.0–108.0)
Patient temperature: 99.5
VT: 500 mL
pCO2 arterial: 64 mmHg — ABNORMAL HIGH (ref 32.0–48.0)
pH, Arterial: 7.387 (ref 7.350–7.450)
pH, Arterial: 7.359 (ref 7.350–7.450)

## 2017-09-15 LAB — PHOSPHORUS: Phosphorus: 2.9 mg/dL (ref 2.5–4.6)

## 2017-09-15 LAB — URINALYSIS, ROUTINE W REFLEX MICROSCOPIC
Bilirubin Urine: NEGATIVE
HGB URINE DIPSTICK: NEGATIVE
KETONES UR: NEGATIVE mg/dL
Leukocytes, UA: NEGATIVE
NITRITE: NEGATIVE
PROTEIN: 30 mg/dL — AB
Specific Gravity, Urine: 1.022 (ref 1.005–1.030)
pH: 5 (ref 5.0–8.0)

## 2017-09-15 LAB — CBC
HCT: 33.6 % — ABNORMAL LOW (ref 39.0–52.0)
Hemoglobin: 10 g/dL — ABNORMAL LOW (ref 13.0–17.0)
MCH: 27.6 pg (ref 26.0–34.0)
MCHC: 29.8 g/dL — ABNORMAL LOW (ref 30.0–36.0)
MCV: 92.8 fL (ref 78.0–100.0)
Platelets: 144 10*3/uL — ABNORMAL LOW (ref 150–400)
RBC: 3.62 MIL/uL — AB (ref 4.22–5.81)
RDW: 19.4 % — ABNORMAL HIGH (ref 11.5–15.5)
WBC: 18.4 10*3/uL — ABNORMAL HIGH (ref 4.0–10.5)

## 2017-09-15 LAB — MAGNESIUM: MAGNESIUM: 1.8 mg/dL (ref 1.7–2.4)

## 2017-09-15 NOTE — Progress Notes (Signed)
Pulmonary [place]   PULMONARY SERVICE  PROGRESS NOTE  Date of Service: 09/15/2017  Dwayne Griffin  LGX:[id number]  DOB: June 19, 1940   DOA: 09/18/2017  Referring Physician: Merton Border, MD  HPI: Dwayne Griffin is a 77 y.o. male seen for follow up of Acute on Chronic Respiratory Failure.  Patient had to be placed on the ventilator had some issues with blood pressure overnight.  Patient's chest x-ray also showing pneumonia.  Was started on vancomycin and meropenem for fevers.  The chest x-ray was reviewed shows a right upper lobe atelectasis versus pneumonia.  Which is definitely a big change from the last film.  Right now is on assist control mode is requiring 50% oxygen with a PEEP of 5.  Patient is critically ill at this time  Medications: Reviewed on Rounds  Physical Exam:  Vitals: Temperature 98.4 pulse 94 respiratory rate 17 blood pressure 102/62 saturations 99%  Ventilator Settings mode of ventilation assist control FiO2 50% tidal volume 558 PEEP 8  . General: Comfortable at this time . Eyes: Grossly normal lids, irises & conjunctiva . ENT: grossly tongue is normal . Neck: no obvious mass . Cardiovascular: S1 S2 normal no gallop . Respiratory: No rhonchi are noted at this time . Abdomen: soft . Skin: no rash seen on limited exam . Musculoskeletal: not rigid . Psychiatric:unable to assess . Neurologic: no seizure no involuntary movements         Lab Data:   Basic Metabolic Panel: Recent Labs  Lab 09/15/17 0302  NA 141  K 4.7  CL 98*  CO2 34*  GLUCOSE 427*  BUN 41*  CREATININE 0.73  CALCIUM 9.1  MG 1.8  PHOS 2.9    Liver Function Tests: No results for input(s): AST, ALT, ALKPHOS, BILITOT, PROT, ALBUMIN in the last 168 hours. No results for input(s): LIPASE, AMYLASE in the last 168 hours. No results for input(s): AMMONIA in the last 168 hours.  CBC: Recent Labs  Lab 09/15/17 0302  WBC 18.4*  HGB 10.0*  HCT  33.6*  MCV 92.8  PLT 144*    Cardiac Enzymes: No results for input(s): CKTOTAL, CKMB, CKMBINDEX, TROPONINI in the last 168 hours.  BNP (last 3 results) No results for input(s): BNP in the last 8760 hours.  ProBNP (last 3 results) No results for input(s): PROBNP in the last 8760 hours.  Radiological Exams: Dg Chest Port 1 View  Result Date: 09/15/2017 CLINICAL DATA:  Respiratory failure. EXAM: PORTABLE CHEST 1 VIEW COMPARISON:  Radiograph 09/06/2017 FINDINGS: Tracheostomy tube at the thoracic inlet. Enteric tube is in place, tip not included in the field of view below the diaphragm. Prior right central line is been removed. There is a new dense right upper lobe opacity. Surgical staples at the right hilum. Unchanged heart size and mediastinal contours. Unchanged left lung base opacity. Left pleural effusion appears similar, similar blunting of right costophrenic angle. No pneumothorax. Prominent pulmonary vasculature is unchanged. Surgical fixation of right posterior rib again seen. IMPRESSION: 1. Dense right upper lobe opacity is new from prior exam, may represent atelectasis/collapse, pneumonia or aspiration. 2. Unchanged left lung base opacity likely combination of atelectasis and pleural fluid. 3. Tracheostomy tube at the thoracic inlet. Electronically Signed   By: Jeb Levering M.D.   On: 09/15/2017 01:47    Assessment/Plan Principal Problem:   Acute on chronic respiratory failure with hypoxemia [facility]) Active Problems:   Chronic atrial fibrillation (HCC)   COPD (chronic obstructive  pulmonary disease) ([place])   Sepsis due to methicillin resistant Staphylococcus aureus (MRSA) ([place])   Healthcare-associated pneumonia   1. Acute on chronic respiratory failure with hypoxia we will continue with full vent support at this time.  Patient is critically ill not able to continue at this time.  Will continue to monitor the spontaneous breathing trials and mechanics to see if there is any ability  to wean.  My concern is mainly that he has a new pneumonia and there is probably some underlying atelectasis which could represent mucous plugging and might possibly need to have an airway examination. 2. Healthcare associated pneumonia recurring infiltrate in the upper lobe will continue with the antibiotics follow-up x-rays consider bronchoscopy if there is no improvement. 3. Sepsis due to MRSA has been treated we will continue to follow now is back on vancomycin 4. Chronic atrial fibrillation rate is controlled we will continue with supportive care overall prognosis is guarded. 5. COPD severe disease we will follow along   I have personally seen and evaluated the patient, evaluated laboratory and imaging results, formulated the assessment and plan and placed orders. The Patient requires high complexity decision making for assessment and support.  Case was discussed on Rounds with the Respiratory Therapy Staff patient is critically ill in danger of cardiac arrest time spent 35 minutes with review of the medical record charts x-rays as well as coordination of care with primary care team and respiratory therapy on rounds  Allyne Gee, MD [facility] Pulmonary Critical Care Medicine Sleep Medicine

## 2017-09-16 DIAGNOSIS — J449 Chronic obstructive pulmonary disease, unspecified: Secondary | ICD-10-CM | POA: Diagnosis not present

## 2017-09-16 DIAGNOSIS — I482 Chronic atrial fibrillation: Secondary | ICD-10-CM | POA: Diagnosis not present

## 2017-09-16 DIAGNOSIS — J189 Pneumonia, unspecified organism: Secondary | ICD-10-CM | POA: Diagnosis not present

## 2017-09-16 DIAGNOSIS — J9621 Acute and chronic respiratory failure with hypoxia: Secondary | ICD-10-CM | POA: Diagnosis not present

## 2017-09-16 LAB — URINE CULTURE
Culture: NO GROWTH
SPECIAL REQUESTS: NORMAL

## 2017-09-16 LAB — CBC WITH DIFFERENTIAL/PLATELET
Abs Immature Granulocytes: 0.3 10*3/uL — ABNORMAL HIGH (ref 0.0–0.1)
Basophils Absolute: 0.1 10*3/uL (ref 0.0–0.1)
Basophils Relative: 0 %
EOS PCT: 0 %
Eosinophils Absolute: 0 10*3/uL (ref 0.0–0.7)
HEMATOCRIT: 34.7 % — AB (ref 39.0–52.0)
HEMOGLOBIN: 10.1 g/dL — AB (ref 13.0–17.0)
Immature Granulocytes: 1 %
LYMPHS ABS: 1.1 10*3/uL (ref 0.7–4.0)
LYMPHS PCT: 5 %
MCH: 27.4 pg (ref 26.0–34.0)
MCHC: 29.1 g/dL — AB (ref 30.0–36.0)
MCV: 94.3 fL (ref 78.0–100.0)
MONO ABS: 1.9 10*3/uL — AB (ref 0.1–1.0)
Monocytes Relative: 8 %
Neutro Abs: 20.3 10*3/uL — ABNORMAL HIGH (ref 1.7–7.7)
Neutrophils Relative %: 86 %
Platelets: 250 10*3/uL (ref 150–400)
RBC: 3.68 MIL/uL — ABNORMAL LOW (ref 4.22–5.81)
RDW: 19.9 % — ABNORMAL HIGH (ref 11.5–15.5)
WBC: 23.6 10*3/uL — ABNORMAL HIGH (ref 4.0–10.5)

## 2017-09-16 LAB — BASIC METABOLIC PANEL
Anion gap: 5 (ref 5–15)
BUN: 39 mg/dL — AB (ref 6–20)
CHLORIDE: 104 mmol/L (ref 101–111)
CO2: 37 mmol/L — AB (ref 22–32)
CREATININE: 0.64 mg/dL (ref 0.61–1.24)
Calcium: 9.2 mg/dL (ref 8.9–10.3)
GFR calc Af Amer: >60 mL/min (ref >60)
GFR calc non Af Amer: >60 mL/min (ref >60)
GLUCOSE: 390 mg/dL — AB (ref 65–99)
Potassium: 4.5 mmol/L (ref 3.5–5.1)
Sodium: 146 mmol/L — ABNORMAL HIGH (ref 135–145)

## 2017-09-16 LAB — VANCOMYCIN, TROUGH: Vancomycin Tr: 22 ug/mL (ref 15–20)

## 2017-09-16 NOTE — Progress Notes (Signed)
[name]   PULMONARY SERVICE  PROGRESS NOTE  Date of Service: 09/16/2017  Dwayne Griffin  PNT:[id number]  DOB: 1940/05/09   DOA: 09/29/2017  Referring Physician: Merton Border, MD  HPI: Dwayne Griffin is a 77 y.o. male seen for follow up of Acute on Chronic Respiratory Failure.  Remains on full vent support analysis assist control mode oxygen was at 50% PEEP was 8  Medications: Reviewed on Rounds  Physical Exam:  Vitals: Temperature 98.3 pulse 101 respiratory rate 20 blood pressure 05/06/1960 saturation 96%  Ventilator Settings mode of ventilation assist control FiO2 40% tidal volume 534cc PEEP of 8  . General: Comfortable at this time . Eyes: Grossly normal lids, irises & conjunctiva . ENT: grossly tongue is normal . Neck: no obvious mass . Cardiovascular: S1 S2 normal no gallop . Respiratory: Scattered rhonchi are noted bilaterally . Abdomen: soft . Skin: no rash seen on limited exam . Musculoskeletal: not rigid . Psychiatric:unable to assess . Neurologic: no seizure no involuntary movements         Lab Data:   Basic Metabolic Panel: Recent Labs  Lab 09/15/17 0302 09/16/17 0955  NA 141 146*  K 4.7 4.5  CL 98* 104  CO2 34* 37*  GLUCOSE 427* 390*  BUN 41* 39*  CREATININE 0.73 0.64  CALCIUM 9.1 9.2  MG 1.8  --   PHOS 2.9  --     Liver Function Tests: No results for input(s): AST, ALT, ALKPHOS, BILITOT, PROT, ALBUMIN in the last 168 hours. No results for input(s): LIPASE, AMYLASE in the last 168 hours. No results for input(s): AMMONIA in the last 168 hours.  CBC: Recent Labs  Lab 09/15/17 0302 09/16/17 0955  WBC 18.4* 23.6*  NEUTROABS  --  20.3*  HGB 10.0* 10.1*  HCT 33.6* 34.7*  MCV 92.8 94.3  PLT 144* 250    Cardiac Enzymes: No results for input(s): CKTOTAL, CKMB, CKMBINDEX, TROPONINI in the last 168 hours.  BNP (last 3 results) No results for input(s): BNP in the last 8760 hours.  ProBNP  (last 3 results) No results for input(s): PROBNP in the last 8760 hours.  Radiological Exams: Dg Chest Port 1 View  Result Date: 09/15/2017 CLINICAL DATA:  Respiratory failure. EXAM: PORTABLE CHEST 1 VIEW COMPARISON:  Radiograph 09/06/2017 FINDINGS: Tracheostomy tube at the thoracic inlet. Enteric tube is in place, tip not included in the field of view below the diaphragm. Prior right central line is been removed. There is a new dense right upper lobe opacity. Surgical staples at the right hilum. Unchanged heart size and mediastinal contours. Unchanged left lung base opacity. Left pleural effusion appears similar, similar blunting of right costophrenic angle. No pneumothorax. Prominent pulmonary vasculature is unchanged. Surgical fixation of right posterior rib again seen. IMPRESSION: 1. Dense right upper lobe opacity is new from prior exam, may represent atelectasis/collapse, pneumonia or aspiration. 2. Unchanged left lung base opacity likely combination of atelectasis and pleural fluid. 3. Tracheostomy tube at the thoracic inlet. Electronically Signed   By: Jeb Levering M.D.   On: 09/15/2017 01:47    Assessment/Plan Principal Problem:   Acute on chronic respiratory failure with hypoxemia (HCC) Active Problems:   Chronic atrial fibrillation (HCC)   COPD (chronic obstructive pulmonary disease) (HCC)   Sepsis due to methicillin resistant Staphylococcus aureus (MRSA) (HCC)   Healthcare-associated pneumonia   1. Acute chronic respiratory failure with hypoxia patient's FiO2 will be decreased down to 40% also try  to wean the PEEP down saturations were improved continue with supportive care 2. Chronic atrial fibrillation rate is controlled we will continue present management  3. COPD severe disease we will continue to follow prognosis guarded 4. Sepsis due to MRSA treated continue supportive care hemodynamically stable 5. Healthcare associated pneumonia follow-up x-ray still showing dense  consolidation   I have personally seen and evaluated the patient, evaluated laboratory and imaging results, formulated the assessment and plan and placed orders. The Patient requires high complexity decision making for assessment and support.  Case was discussed on Rounds with the Respiratory Therapy Staff  Allyne Gee, MD [facility] Pulmonary Critical Care Medicine Sleep Medicine

## 2017-09-17 DIAGNOSIS — I482 Chronic atrial fibrillation: Secondary | ICD-10-CM | POA: Diagnosis not present

## 2017-09-17 DIAGNOSIS — J449 Chronic obstructive pulmonary disease, unspecified: Secondary | ICD-10-CM | POA: Diagnosis not present

## 2017-09-17 DIAGNOSIS — J9621 Acute and chronic respiratory failure with hypoxia: Secondary | ICD-10-CM | POA: Diagnosis not present

## 2017-09-17 DIAGNOSIS — J189 Pneumonia, unspecified organism: Secondary | ICD-10-CM | POA: Diagnosis not present

## 2017-09-17 LAB — VANCOMYCIN, TROUGH: Vancomycin Tr: 12 ug/mL — ABNORMAL LOW (ref 15–20)

## 2017-09-17 NOTE — Progress Notes (Signed)
Pulmonary [place]   PULMONARY SERVICE  PROGRESS NOTE  Date of Service: 09/17/2017  Dwayne Griffin  HCW:[id number]  DOB: June 07, 1940   DOA: 09/20/2017  Referring Physician: Merton Border, MD  HPI: Dwayne Griffin is a 77 y.o. male seen for follow up of Acute on Chronic Respiratory Failure.  Patient is comfortable without distress.  Has not been doing very well as far as being able to wean his remains on the ventilator in assist control mode  Medications: Reviewed on Rounds  Physical Exam:  Vitals: Temperature 99.1 pulse 95 respiratory rate 28 blood pressure 98/57 saturations 94%  Ventilator Settings mode of ventilation assist control FiO2 40% tidal volume 550 PEEP 5  . General: Comfortable at this time . Eyes: Grossly normal lids, irises & conjunctiva . ENT: grossly tongue is normal . Neck: no obvious mass . Cardiovascular: S1 S2 normal no gallop . Respiratory: No rhonchi are noted at this time . Abdomen: soft . Skin: no rash seen on limited exam . Musculoskeletal: not rigid . Psychiatric:unable to assess . Neurologic: no seizure no involuntary movements         Lab Data:   Basic Metabolic Panel: Recent Labs  Lab 09/15/17 0302 09/16/17 0955  NA 141 146*  K 4.7 4.5  CL 98* 104  CO2 34* 37*  GLUCOSE 427* 390*  BUN 41* 39*  CREATININE 0.73 0.64  CALCIUM 9.1 9.2  MG 1.8  --   PHOS 2.9  --     Liver Function Tests: No results for input(s): AST, ALT, ALKPHOS, BILITOT, PROT, ALBUMIN in the last 168 hours. No results for input(s): LIPASE, AMYLASE in the last 168 hours. No results for input(s): AMMONIA in the last 168 hours.  CBC: Recent Labs  Lab 09/15/17 0302 09/16/17 0955  WBC 18.4* 23.6*  NEUTROABS  --  20.3*  HGB 10.0* 10.1*  HCT 33.6* 34.7*  MCV 92.8 94.3  PLT 144* 250    Cardiac Enzymes: No results for input(s): CKTOTAL, CKMB, CKMBINDEX, TROPONINI in the last 168 hours.  BNP (last 3 results) No results  for input(s): BNP in the last 8760 hours.  ProBNP (last 3 results) No results for input(s): PROBNP in the last 8760 hours.  Radiological Exams: No results found.  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 Acute on chronic respiratory failure with hypoxia we will continue with full support patient's not been tolerating weaning so far.  Also patient had an episode of vomiting and is felt to most likely have aspirated.  We will need to have a follow-up chest x-ray done to assess. 2. Chronic atrial fibrillation currently the rate is controlled we will continue to follow along. 3. COPD severe advanced disease prognosis guarded 4. Sepsis hemodynamically stable although blood pressure is running a little bit on the low side.  We need to continue to monitor closely. 5. Healthcare associated pneumonia treated with antibiotics we will follow along   I have personally seen and evaluated the patient, evaluated laboratory and imaging results, formulated the assessment and plan and placed orders. The Patient requires high complexity decision making for assessment and support.  Case was discussed on Rounds with the Respiratory Therapy Staff  Allyne Gee, MD [facility] Pulmonary Critical  Care Medicine Sleep Medicine

## 2017-09-18 DIAGNOSIS — J189 Pneumonia, unspecified organism: Secondary | ICD-10-CM | POA: Diagnosis not present

## 2017-09-18 DIAGNOSIS — J449 Chronic obstructive pulmonary disease, unspecified: Secondary | ICD-10-CM | POA: Diagnosis not present

## 2017-09-18 DIAGNOSIS — J9621 Acute and chronic respiratory failure with hypoxia: Secondary | ICD-10-CM | POA: Diagnosis not present

## 2017-09-18 DIAGNOSIS — I482 Chronic atrial fibrillation: Secondary | ICD-10-CM | POA: Diagnosis not present

## 2017-09-18 LAB — BLOOD CULTURE ID PANEL (REFLEXED)

## 2017-09-18 LAB — CULTURE, RESPIRATORY W GRAM STAIN: Special Requests: NORMAL

## 2017-09-18 LAB — BASIC METABOLIC PANEL
Anion gap: 6 (ref 5–15)
BUN: 40 mg/dL — ABNORMAL HIGH (ref 6–20)
CALCIUM: 8.8 mg/dL — AB (ref 8.9–10.3)
CO2: 35 mmol/L — ABNORMAL HIGH (ref 22–32)
CREATININE: 0.62 mg/dL (ref 0.61–1.24)
Chloride: 106 mmol/L (ref 101–111)
GFR calc Af Amer: >60 mL/min (ref >60)
Glucose, Bld: 348 mg/dL — ABNORMAL HIGH (ref 65–99)
Potassium: 3.6 mmol/L (ref 3.5–5.1)
SODIUM: 147 mmol/L — AB (ref 135–145)

## 2017-09-18 NOTE — Progress Notes (Signed)
Pulmonary [place]   PULMONARY SERVICE  PROGRESS NOTE  Date of Service: 09/18/2017  Humbert Morozov Carillo  WUJ:[id number]  DOB: [birth year]   DOA: 10/09/2017  Referring Physician: Merton Border, MD  HPI: SEM MCCAUGHEY is a 77 y.o. male seen for follow up of Acute on Chronic Respiratory Failure.  Patient was attempted on pressure support wean and was not able to tolerate.  Patient had to be placed back on the ventilator currently is on assist control and requiring 40% oxygen  Medications: Reviewed on Rounds  Physical Exam:  Vitals: Temperature 99.0 pulse 86 respiratory rate 18 blood pressure 134/72 saturations 98%  Ventilator Settings mode of ventilation assist control FiO2 40% tidal volume 500 PEEP 5  . General: Comfortable at this time . Eyes: Grossly normal lids, irises & conjunctiva . ENT: grossly tongue is normal . Neck: no obvious mass . Cardiovascular: S1 S2 normal no gallop . Respiratory: No rhonchi expansion is equal . Abdomen: soft . Skin: no rash seen on limited exam . Musculoskeletal: not rigid . Psychiatric:unable to assess . Neurologic: no seizure no involuntary movements         Lab Data:   Basic Metabolic Panel: Recent Labs  Lab 09/15/17 0302 09/16/17 0955 09/18/17 0800  NA 141 146* 147*  K 4.7 4.5 3.6  CL 98* 104 106  CO2 34* 37* 35*  GLUCOSE 427* 390* 348*  BUN 41* 39* 40*  CREATININE 0.73 0.64 0.62  CALCIUM 9.1 9.2 8.8*  MG 1.8  --   --   PHOS 2.9  --   --     Liver Function Tests: No results for input(s): AST, ALT, ALKPHOS, BILITOT, PROT, ALBUMIN in the last 168 hours. No results for input(s): LIPASE, AMYLASE in the last 168 hours. No results for input(s): AMMONIA in the last 168 hours.  CBC: Recent Labs  Lab 09/15/17 0302 09/16/17 0955  WBC 18.4* 23.6*  NEUTROABS  --  20.3*  HGB 10.0* 10.1*  HCT 33.6* 34.7*  MCV 92.8 94.3  PLT 144* 250    Cardiac Enzymes: No results for input(s): CKTOTAL,  CKMB, CKMBINDEX, TROPONINI in the last 168 hours.  BNP (last 3 results) No results for input(s): BNP in the last 8760 hours.  ProBNP (last 3 results) No results for input(s): PROBNP in the last 8760 hours.  Radiological Exams: No results found.  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 Acute on chronic respiratory failure with hypoxia we will continue with full support on assist control continue pulmonary toilet supportive care 2. Chronic atrial fibrillation rate is controlled we will follow along 3. COPD severe disease continue with present management 4. Sepsis due to MRSA clinically resolved hemodynamically stable 5. Healthcare associated pneumonia has been treated follow-up x-ray as needed   I have personally seen and evaluated the patient, evaluated laboratory and imaging results, formulated the assessment and plan and placed orders. The Patient requires high complexity decision making for assessment and support.  Case was discussed on Rounds with the Respiratory Therapy Staff  Allyne Gee, MD [facility] Pulmonary Critical Care Medicine Sleep Medicine

## 2017-09-19 ENCOUNTER — Other Ambulatory Visit (HOSPITAL_COMMUNITY): Payer: Self-pay

## 2017-09-19 DIAGNOSIS — I482 Chronic atrial fibrillation: Secondary | ICD-10-CM | POA: Diagnosis not present

## 2017-09-19 DIAGNOSIS — J9621 Acute and chronic respiratory failure with hypoxia: Secondary | ICD-10-CM | POA: Diagnosis not present

## 2017-09-19 DIAGNOSIS — J449 Chronic obstructive pulmonary disease, unspecified: Secondary | ICD-10-CM | POA: Diagnosis not present

## 2017-09-19 DIAGNOSIS — J189 Pneumonia, unspecified organism: Secondary | ICD-10-CM | POA: Diagnosis not present

## 2017-09-19 NOTE — Progress Notes (Signed)
Pulmonary [place]   PULMONARY SERVICE  PROGRESS NOTE  Date of Service: 09/19/2017  Dwayne Griffin  Dwayne Griffin  DOB: October 11, 1940   DOA: 10/05/2017  Referring Physician: Merton Border, MD  HPI: Dwayne Griffin is a 77 y.o. male seen for follow up of Acute on Chronic Respiratory Failure.  Comfortable without distress right now patient's on assist control mode has been requiring 40% oxygen no distress at this time.  Medications: Reviewed on Rounds  Physical Exam:  Vitals: Temperature 97.4 pulse 83 respiratory rate 12 blood pressure 108/54 saturations 96%  Ventilator Settings mode of ventilation assist control FiO2 40% tidal volume 530 PEEP 5  . General: Comfortable at this time . Eyes: Grossly normal lids, irises & conjunctiva . ENT: grossly tongue is normal . Neck: no obvious mass . Cardiovascular: S1 S2 normal no gallop . Respiratory: Good air entry no rhonchi . Abdomen: soft . Skin: no rash seen on limited exam . Musculoskeletal: not rigid . Psychiatric:unable to assess . Neurologic: no seizure no involuntary movements         Lab Data:   Basic Metabolic Panel: Recent Labs  Lab 09/15/17 0302 09/16/17 0955 09/18/17 0800  NA 141 146* 147*  K 4.7 4.5 3.6  CL 98* 104 106  CO2 34* 37* 35*  GLUCOSE 427* 390* 348*  BUN 41* 39* 40*  CREATININE 0.73 0.64 0.62  CALCIUM 9.1 9.2 8.8*  MG 1.8  --   --   PHOS 2.9  --   --     Liver Function Tests: No results for input(s): AST, ALT, ALKPHOS, BILITOT, PROT, ALBUMIN in the last 168 hours. No results for input(s): LIPASE, AMYLASE in the last 168 hours. No results for input(s): AMMONIA in the last 168 hours.  CBC: Recent Labs  Lab 09/15/17 0302 09/16/17 0955  WBC 18.4* 23.6*  NEUTROABS  --  20.3*  HGB 10.0* 10.1*  HCT 33.6* 34.7*  MCV 92.8 94.3  PLT 144* 250    Cardiac Enzymes: No results for input(s): CKTOTAL, CKMB, CKMBINDEX, TROPONINI in the last 168 hours.  BNP  (last 3 results) No results for input(s): BNP in the last 8760 hours.  ProBNP (last 3 results) No results for input(s): PROBNP in the last 8760 hours.  Radiological Exams: Dg Abd 1 View  Result Date: 09/19/2017 CLINICAL DATA:  Increased vomiting.  Concern for bowel obstruction. EXAM: ABDOMEN - 1 VIEW COMPARISON:  Abdominal x-ray dated September 12, 2017. FINDINGS: Unchanged positioning of the enteric tube with the side port in the distal esophagus near the GE junction. Nonobstructive bowel gas pattern. Possible small bilateral renal calculi. No acute osseous abnormality. IMPRESSION: 1. Unchanged positioning of the enteric tube with the side port in the distal esophagus. Recommend advancement. 2. No evidence of bowel obstruction. 3. Possible small bilateral nephrolithiasis. Electronically Signed   By: Titus Dubin M.D.   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 Acute on chronic respiratory failure with hypoxia the patient has not been tolerating weaning remains on assist control mode last chest x-ray had shown presence of pneumonia currently is on Rocephin will discuss with primary care team 2. Chronic atrial fibrillation rate is controlled we will continue with  supportive care 3. COPD at baseline severe disease 4. Sepsis due to MRSA patient right now is on Rocephin will discuss with primary care team as indicated 5. Healthcare associated pneumonia follow-up x-ray ordered   I have personally seen and evaluated the patient, evaluated laboratory and imaging results, formulated the assessment and plan and placed orders. The Patient requires high complexity decision making for assessment and support.   Case was discussed on Rounds with the Respiratory Therapy Staff  Allyne Gee, MD [facility] Pulmonary Critical Care Medicine Sleep Medicine

## 2017-09-20 DIAGNOSIS — J449 Chronic obstructive pulmonary disease, unspecified: Secondary | ICD-10-CM | POA: Diagnosis not present

## 2017-09-20 DIAGNOSIS — I482 Chronic atrial fibrillation: Secondary | ICD-10-CM | POA: Diagnosis not present

## 2017-09-20 DIAGNOSIS — J189 Pneumonia, unspecified organism: Secondary | ICD-10-CM | POA: Diagnosis not present

## 2017-09-20 DIAGNOSIS — J9621 Acute and chronic respiratory failure with hypoxia: Secondary | ICD-10-CM | POA: Diagnosis not present

## 2017-09-20 LAB — BASIC METABOLIC PANEL WITH GFR
Anion gap: 6 (ref 5–15)
BUN: 36 mg/dL — ABNORMAL HIGH (ref 6–20)
CO2: 36 mmol/L — ABNORMAL HIGH (ref 22–32)
Calcium: 9.1 mg/dL (ref 8.9–10.3)
Chloride: 107 mmol/L (ref 101–111)
Creatinine, Ser: 0.58 mg/dL — ABNORMAL LOW (ref 0.61–1.24)
GFR calc Af Amer: >60 mL/min
GFR calc non Af Amer: >60 mL/min
Glucose, Bld: 131 mg/dL — ABNORMAL HIGH (ref 65–99)
Potassium: 3.4 mmol/L — ABNORMAL LOW (ref 3.5–5.1)
Sodium: 149 mmol/L — ABNORMAL HIGH (ref 135–145)

## 2017-09-20 LAB — CULTURE, BLOOD (ROUTINE X 2)
Culture: NO GROWTH
Special Requests: ADEQUATE
Special Requests: ADEQUATE

## 2017-09-20 LAB — CBC
HEMATOCRIT: 32.1 % — AB (ref 39.0–52.0)
Hemoglobin: 9.4 g/dL — ABNORMAL LOW (ref 13.0–17.0)
MCH: 27.1 pg (ref 26.0–34.0)
MCHC: 29.3 g/dL — ABNORMAL LOW (ref 30.0–36.0)
MCV: 92.5 fL (ref 78.0–100.0)
Platelets: 240 10*3/uL (ref 150–400)
RBC: 3.47 MIL/uL — ABNORMAL LOW (ref 4.22–5.81)
RDW: 19.7 % — AB (ref 11.5–15.5)
WBC: 15.1 10*3/uL — AB (ref 4.0–10.5)

## 2017-09-20 NOTE — Progress Notes (Signed)
Pulmonary [place]   PULMONARY SERVICE  PROGRESS NOTE  Date of Service: 09/20/2017  Tilman Mcclaren Denz  WUX:[id number]  DOB: [birth year]   DOA: 09/19/2017  Referring Physician: Merton Border, MD  HPI: Dwayne Griffin is a 77 y.o. male seen for follow up of Acute on Chronic Respiratory Failure.  Patient was started back on pressure support mode has been on 40% oxygen with good saturations.  The chest x-ray was done yesterday also shows improvement.  Medications: Reviewed on Rounds  Physical Exam:  Vitals: Temperature 99.1 pulse 93 respiratory rate 18 blood pressure 9059 saturations 91%  Ventilator Settings currently on pressure support mode FiO2 40% tidal volume 460 pressure support 12 PEEP 5  . General: Comfortable at this time . Eyes: Grossly normal lids, irises & conjunctiva . ENT: grossly tongue is normal . Neck: no obvious mass . Cardiovascular: S1 S2 normal no gallop . Respiratory: Good aeration no rhonchi noted . Abdomen: soft . Skin: no rash seen on limited exam . Musculoskeletal: not rigid . Psychiatric:unable to assess . Neurologic: no seizure no involuntary movements         Lab Data:   Basic Metabolic Panel: Recent Labs  Lab 09/15/17 0302 09/16/17 0955 09/18/17 0800 09/20/17 0759  NA 141 146* 147* 149*  K 4.7 4.5 3.6 3.4*  CL 98* 104 106 107  CO2 34* 37* 35* 36*  GLUCOSE 427* 390* 348* 131*  BUN 41* 39* 40* 36*  CREATININE 0.73 0.64 0.62 0.58*  CALCIUM 9.1 9.2 8.8* 9.1  MG 1.8  --   --   --   PHOS 2.9  --   --   --     Liver Function Tests: No results for input(s): AST, ALT, ALKPHOS, BILITOT, PROT, ALBUMIN in the last 168 hours. No results for input(s): LIPASE, AMYLASE in the last 168 hours. No results for input(s): AMMONIA in the last 168 hours.  CBC: Recent Labs  Lab 09/15/17 0302 09/16/17 0955 09/20/17 0759  WBC 18.4* 23.6* 15.1*  NEUTROABS  --  20.3*  --   HGB 10.0* 10.1* 9.4*  HCT 33.6* 34.7*  32.1*  MCV 92.8 94.3 92.5  PLT 144* 250 240    Cardiac Enzymes: No results for input(s): CKTOTAL, CKMB, CKMBINDEX, TROPONINI in the last 168 hours.  BNP (last 3 results) No results for input(s): BNP in the last 8760 hours.  ProBNP (last 3 results) No results for input(s): PROBNP in the last 8760 hours.  Radiological Exams: Dg Abd 1 View  Result Date: 09/19/2017 CLINICAL DATA:  Increased vomiting.  Concern for bowel obstruction. EXAM: ABDOMEN - 1 VIEW COMPARISON:  Abdominal x-ray dated September 12, 2017. FINDINGS: Unchanged positioning of the enteric tube with the side port in the distal esophagus near the GE junction. Nonobstructive bowel gas pattern. Possible small bilateral renal calculi. No acute osseous abnormality. IMPRESSION: 1. Unchanged positioning of the enteric tube with the side port in the distal esophagus. Recommend advancement. 2. No evidence of bowel obstruction. 3. Possible small bilateral nephrolithiasis. Electronically Signed   By: Titus Dubin M.D.   On: 09/19/2017 09:49   Dg Chest Port 1 View  Result Date: 09/19/2017 CLINICAL DATA:  Acute on chronic respiratory failure. EXAM: PORTABLE CHEST 1 VIEW COMPARISON:  Single-view of the chest 09/15/2017 and 09/06/2017. FINDINGS: Tracheostomy tube and NG tube remain in place. Left basilar and right upper lobe airspace opacities show some improvement since the most recent examination. No pneumothorax. No pleural effusion.  Heart size is upper normal. Plate and screws along the posterior arc of the right fifth rib are unchanged. IMPRESSION: Some improvement in right upper lobe and left basilar airspace disease since the most recent examination. Electronically Signed   By: Inge Rise M.D.   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 Acute on chronic respiratory failure with hypoxia we will continue with pulmonary toilet supportive care patient is tolerating the pressure support 2. Also chest x-ray showing some improvement so we will continue to advance. 3. Chronic atrial fibrillation rate is controlled we will continue with supportive care 4. COPD severe disease we will continue on sepsis due to MRSA treated with antibiotics we will follow.  Associated pneumonia treated with antibiotics   I have personally seen and evaluated the patient, evaluated laboratory and imaging results, formulated the assessment and plan and placed orders. The Patient requires high complexity decision making for assessment and support.  Case was discussed on Rounds with the Respiratory Therapy Staff  Allyne Gee, MD [facility] Pulmonary Critical Care Medicine Sleep Medicine

## 2017-09-21 ENCOUNTER — Other Ambulatory Visit (HOSPITAL_COMMUNITY): Payer: Self-pay

## 2017-09-21 DIAGNOSIS — J449 Chronic obstructive pulmonary disease, unspecified: Secondary | ICD-10-CM | POA: Diagnosis not present

## 2017-09-21 DIAGNOSIS — A4102 Sepsis due to Methicillin resistant Staphylococcus aureus: Secondary | ICD-10-CM | POA: Diagnosis not present

## 2017-09-21 DIAGNOSIS — J9621 Acute and chronic respiratory failure with hypoxia: Secondary | ICD-10-CM | POA: Diagnosis not present

## 2017-09-21 DIAGNOSIS — I482 Chronic atrial fibrillation: Secondary | ICD-10-CM | POA: Diagnosis not present

## 2017-09-21 DIAGNOSIS — J189 Pneumonia, unspecified organism: Secondary | ICD-10-CM | POA: Diagnosis not present

## 2017-09-21 NOTE — Progress Notes (Signed)
Patient ID: Dwayne Griffin, male   DOB: 04-19-1940, 77 y.o.   MRN: [id number]   Request made for percutaneous gastric tube placement  CT today:  IMPRESSION: 1. Moderately distended and redundant colon containing moderate amount of fecal material and draped around much of the stomach. This places the patient at higher risk for colonic injury with percutaneous gastrostomy placement. A bowel prep may be helpful to decompress the colon and may make gastrostomy tube placement more feasible and reduce risk.   Rec:  Bowel prep           Re scan after prep           If bowel away from stomach can consider percutaneous gastric tune placement In IR.  Discussed with Dr Laren Everts Will cancel order for now He will order bowel prep and re order when G tube when feels appropriate

## 2017-09-21 NOTE — Progress Notes (Signed)
Pulmonary [place]wayne [place]   PULMONARY SERVICE  PROGRESS NOTE  Date of Service: 09/21/2017  Dwayne Griffin  XQJ:[id number]  DOB: 04-22-1940   DOA: 09/29/2017  Referring Physician: Merton Border, MD  HPI: Dwayne Griffin is a 77 y.o. male seen for follow up of Acute on Chronic Respiratory Failure.  Currently comfortable without distress at this time.  Patient remains on pressure support mode.  Has been on pressure support 12/5  Medications: Reviewed on Rounds  Physical Exam:  Vitals: Temperature 98.2 pulse 90 respiratory rate 27 blood pressure 123/73 saturations 97%  Ventilator Settings mode of ventilation pressure support FiO2 40% tidal volume 400 pressure support 12 PEEP 5  . General: Comfortable at this time . Eyes: Grossly normal lids, irises & conjunctiva . ENT: grossly tongue is normal . Neck: no obvious mass . Cardiovascular: S1 S2 normal no gallop . Respiratory: Coarse breath sounds no rhonchi noted at this time . Abdomen: soft . Skin: no rash seen on limited exam . Musculoskeletal: not rigid . Psychiatric:unable to assess . Neurologic: no seizure no involuntary movements         Lab Data:   Basic Metabolic Panel: Recent Labs  Lab 09/15/17 0302 09/16/17 0955 09/18/17 0800 09/20/17 0759  NA 141 146* 147* 149*  K 4.7 4.5 3.6 3.4*  CL 98* 104 106 107  CO2 34* 37* 35* 36*  GLUCOSE 427* 390* 348* 131*  BUN 41* 39* 40* 36*  CREATININE 0.73 0.64 0.62 0.58*  CALCIUM 9.1 9.2 8.8* 9.1  MG 1.8  --   --   --   PHOS 2.9  --   --   --     Liver Function Tests: No results for input(s): AST, ALT, ALKPHOS, BILITOT, PROT, ALBUMIN in the last 168 hours. No results for input(s): LIPASE, AMYLASE in the last 168 hours. No results for input(s): AMMONIA in the last 168 hours.  CBC: Recent Labs  Lab 09/15/17 0302 09/16/17 0955 09/20/17 0759  WBC 18.4* 23.6* 15.1*  NEUTROABS  --  20.3*  --   HGB 10.0* 10.1* 9.4*  HCT 33.6* 34.7*  32.1*  MCV 92.8 94.3 92.5  PLT 144* 250 240    Cardiac Enzymes: No results for input(s): CKTOTAL, CKMB, CKMBINDEX, TROPONINI in the last 168 hours.  BNP (last 3 results) No results for input(s): BNP in the last 8760 hours.  ProBNP (last 3 results) No results for input(s): PROBNP in the last 8760 hours.  Radiological Exams: Ct Abdomen Wo Contrast  Result Date: 09/21/2017 CLINICAL DATA:  Assessment for possible percutaneous gastrostomy tube placement. EXAM: CT ABDOMEN WITHOUT CONTRAST TECHNIQUE: Multidetector CT imaging of the abdomen was performed following the standard protocol without IV contrast. COMPARISON:  None. FINDINGS: Lower chest: Bilateral lower lobe atelectasis present. Hepatobiliary: Unenhanced appearance of the liver is unremarkable. The gallbladder appears mildly distended without surrounding inflammation. Pancreas: Atrophic pancreas with scattered calcifications suggestive of prior pancreatitis. Spleen: Normal in size without focal abnormality. Adrenals/Urinary Tract: Adrenal glands are unremarkable. Kidneys are normal, without renal calculi, focal lesion, or hydronephrosis. Stomach/Bowel: No hiatal hernia. Nasogastric tube extends into the mid body of the stomach. The stomach is decompressed. There is moderate dilatation of the colon with redundant transverse colon and splenic flexure draped around the proximal and distal aspects of the stomach. The colon itself at this level contains a moderate amount of fecal material. Anatomy is not ideal for gastrostomy tube placement with likely increased risk of colonic injury. No  evidence of small bowel dilatation, bowel inflammation, mass or free intraperitoneal air. There is a roughly 1.5 x 2.0 cm rounded structure immediately abutting the posterior wall of the gastric antrum and demonstrating focal posterior calcification. This also lies just superior to the anterior aspect of the pancreatic head. This measures cystic/complex cystic density  and may represent a GI duplication cyst, cyst related to prior pancreatitis or other cyst. Vascular/Lymphatic: Calcified plaque of the abdominal aorta and proximal common iliac arteries without evidence of aneurysm. Other: No abdominal wall hernia. No ascites or focal fluid collections. Musculoskeletal: The spine demonstrates diffuse degenerative disc disease. IMPRESSION: 1. Moderately distended and redundant colon containing moderate amount of fecal material and draped around much of the stomach. This places the patient at higher risk for colonic injury with percutaneous gastrostomy placement. A bowel prep may be helpful to decompress the colon and may make gastrostomy tube placement more feasible and reduce risk. 2. 2 cm complex cystic structure abutting the posterior wall of the gastric antrum and just superior to the pancreatic head. Differential includes a GI duplication cyst, pseudocyst related to prior pancreatitis or other cyst. Elective evaluation with MRI may be helpful if/when the patient is able. It is felt that this is most likely a benign abnormality. 3. Evidence of prior pancreatitis with atrophic pancreas demonstrating scattered calcifications. Electronically Signed   By: Aletta Edouard M.D.   On: 09/21/2017 12:11    Assessment/Plan Principal Problem:   Acute on chronic respiratory failure with hypoxemia (HCC) Active Problems:   Chronic atrial fibrillation (HCC)   COPD (chronic obstructive pulmonary disease) (HCC)   Sepsis due to methicillin resistant Staphylococcus aureus (MRSA) (HCC)   Healthcare-associated pneumonia   1. Acute on chronic respiratory failure with hypoxia we will continue with weaning on pressure support continue secretion management pulmonary toilet overall patient prognosis is guarded. 2. Chronic atrial fibrillation rate is controlled we will continue to follow. 3. COPD severe disease prognosis guarded 4. Sepsis due to MRSA treated we will continue to  follow. 5. Healthcare associated pneumonia treated with antibiotics we will continue supportive care and monitor closely.   I have personally seen and evaluated the patient, evaluated laboratory and imaging results, formulated the assessment and plan and placed orders. The Patient requires high complexity decision making for assessment and support.  Case was discussed on Rounds with the Respiratory Therapy Staff  Allyne Gee, MD [facility] Pulmonary Critical Care Medicine Sleep Medicine

## 2017-09-22 DIAGNOSIS — I482 Chronic atrial fibrillation: Secondary | ICD-10-CM | POA: Diagnosis not present

## 2017-09-22 DIAGNOSIS — J189 Pneumonia, unspecified organism: Secondary | ICD-10-CM | POA: Diagnosis not present

## 2017-09-22 DIAGNOSIS — J9621 Acute and chronic respiratory failure with hypoxia: Secondary | ICD-10-CM | POA: Diagnosis not present

## 2017-09-22 DIAGNOSIS — J449 Chronic obstructive pulmonary disease, unspecified: Secondary | ICD-10-CM | POA: Diagnosis not present

## 2017-09-22 NOTE — Progress Notes (Signed)
Pulmonary [place]   PULMONARY SERVICE  PROGRESS NOTE  Date of Service: 09/22/2017  Lorraine Terriquez Zayas  ZYY:[id number]  DOB: 1940/11/13   DOA: 09/25/2017  Referring Physician: Merton Border, MD  HPI: Dwayne Griffin is a 77 y.o. male seen for follow up of Acute on Chronic Respiratory Failure.  Today patient was weaning on pressure support with a goal of about 16 hours  Medications: Reviewed on Rounds  Physical Exam:  Vitals: Temperature 98.0 pulse 90 respiratory rate 26 blood pressure 121/63 saturations 98%  Ventilator Settings mode of ventilation pressure support FiO2 40% tidal volume 466 pressure support 12 PEEP 5  . General: Comfortable at this time . Eyes: Grossly normal lids, irises & conjunctiva . ENT: grossly tongue is normal . Neck: no obvious mass . Cardiovascular: S1 S2 normal no gallop . Respiratory: No rhonchi are noted at this time . Abdomen: soft . Skin: no rash seen on limited exam . Musculoskeletal: not rigid . Psychiatric:unable to assess . Neurologic: no seizure no involuntary movements         Lab Data:   Basic Metabolic Panel: Recent Labs  Lab 09/16/17 0955 09/18/17 0800 09/20/17 0759  NA 146* 147* 149*  K 4.5 3.6 3.4*  CL 104 106 107  CO2 37* 35* 36*  GLUCOSE 390* 348* 131*  BUN 39* 40* 36*  CREATININE 0.64 0.62 0.58*  CALCIUM 9.2 8.8* 9.1    Liver Function Tests: No results for input(s): AST, ALT, ALKPHOS, BILITOT, PROT, ALBUMIN in the last 168 hours. No results for input(s): LIPASE, AMYLASE in the last 168 hours. No results for input(s): AMMONIA in the last 168 hours.  CBC: Recent Labs  Lab 09/16/17 0955 09/20/17 0759  WBC 23.6* 15.1*  NEUTROABS 20.3*  --   HGB 10.1* 9.4*  HCT 34.7* 32.1*  MCV 94.3 92.5  PLT 250 240    Cardiac Enzymes: No results for input(s): CKTOTAL, CKMB, CKMBINDEX, TROPONINI in the last 168 hours.  BNP (last 3 results) No results for input(s): BNP in the last  8760 hours.  ProBNP (last 3 results) No results for input(s): PROBNP in the last 8760 hours.  Radiological Exams: Ct Abdomen Wo Contrast  Result Date: 09/21/2017 CLINICAL DATA:  Assessment for possible percutaneous gastrostomy tube placement. EXAM: CT ABDOMEN WITHOUT CONTRAST TECHNIQUE: Multidetector CT imaging of the abdomen was performed following the standard protocol without IV contrast. COMPARISON:  None. FINDINGS: Lower chest: Bilateral lower lobe atelectasis present. Hepatobiliary: Unenhanced appearance of the liver is unremarkable. The gallbladder appears mildly distended without surrounding inflammation. Pancreas: Atrophic pancreas with scattered calcifications suggestive of prior pancreatitis. Spleen: Normal in size without focal abnormality. Adrenals/Urinary Tract: Adrenal glands are unremarkable. Kidneys are normal, without renal calculi, focal lesion, or hydronephrosis. Stomach/Bowel: No hiatal hernia. Nasogastric tube extends into the mid body of the stomach. The stomach is decompressed. There is moderate dilatation of the colon with redundant transverse colon and splenic flexure draped around the proximal and distal aspects of the stomach. The colon itself at this level contains a moderate amount of fecal material. Anatomy is not ideal for gastrostomy tube placement with likely increased risk of colonic injury. No evidence of small bowel dilatation, bowel inflammation, mass or free intraperitoneal air. There is a roughly 1.5 x 2.0 cm rounded structure immediately abutting the posterior wall of the gastric antrum and demonstrating focal posterior calcification. This also lies just superior to the anterior aspect of the pancreatic head. This measures cystic/complex cystic  density and may represent a GI duplication cyst, cyst related to prior pancreatitis or other cyst. Vascular/Lymphatic: Calcified plaque of the abdominal aorta and proximal common iliac arteries without evidence of aneurysm.  Other: No abdominal wall hernia. No ascites or focal fluid collections. Musculoskeletal: The spine demonstrates diffuse degenerative disc disease. IMPRESSION: 1. Moderately distended and redundant colon containing moderate amount of fecal material and draped around much of the stomach. This places the patient at higher risk for colonic injury with percutaneous gastrostomy placement. A bowel prep may be helpful to decompress the colon and may make gastrostomy tube placement more feasible and reduce risk. 2. 2 cm complex cystic structure abutting the posterior wall of the gastric antrum and just superior to the pancreatic head. Differential includes a GI duplication cyst, pseudocyst related to prior pancreatitis or other cyst. Elective evaluation with MRI may be helpful if/when the patient is able. It is felt that this is most likely a benign abnormality. 3. Evidence of prior pancreatitis with atrophic pancreas demonstrating scattered calcifications. Electronically Signed   By: Aletta Edouard M.D.   On: 09/21/2017 12:11    Assessment/Plan Principal Problem:   Acute on chronic respiratory failure with hypoxemia (HCC) Active Problems:   Chronic atrial fibrillation (HCC)   COPD (chronic obstructive pulmonary disease) (HCC)   Sepsis due to methicillin resistant Staphylococcus aureus (MRSA) (HCC)   Healthcare-associated pneumonia   1. Acute on chronic respiratory failure with hypoxia continue with pressure support continue pulmonary toilet supportive care currently is doing well has a goal of 16 hours for today. 2. Chronic atrial fibrillation rate is controlled at this time 3. COPD at baseline we will continue to follow 4. Sepsis due to MRSA treated 5. Healthcare associated pneumonia treated we will continue to follow   I have personally seen and evaluated the patient, evaluated laboratory and imaging results, formulated the assessment and plan and placed orders. The Patient requires high complexity  decision making for assessment and support.  Case was discussed on Rounds with the Respiratory Therapy Staff  Allyne Gee, MD [facility] Pulmonary Critical Care Medicine Sleep Medicine

## 2017-09-23 DIAGNOSIS — J9621 Acute and chronic respiratory failure with hypoxia: Secondary | ICD-10-CM | POA: Diagnosis not present

## 2017-09-23 DIAGNOSIS — J449 Chronic obstructive pulmonary disease, unspecified: Secondary | ICD-10-CM | POA: Diagnosis not present

## 2017-09-23 DIAGNOSIS — I482 Chronic atrial fibrillation: Secondary | ICD-10-CM | POA: Diagnosis not present

## 2017-09-23 DIAGNOSIS — J189 Pneumonia, unspecified organism: Secondary | ICD-10-CM | POA: Diagnosis not present

## 2017-09-23 LAB — CBC
HCT: 33.3 % — ABNORMAL LOW (ref 39.0–52.0)
Hemoglobin: 10 g/dL — ABNORMAL LOW (ref 13.0–17.0)
MCH: 27.3 pg (ref 26.0–34.0)
MCHC: 30 g/dL (ref 30.0–36.0)
MCV: 91 fL (ref 78.0–100.0)
Platelets: 267 10*3/uL (ref 150–400)
RBC: 3.66 MIL/uL — AB (ref 4.22–5.81)
RDW: 19.6 % — AB (ref 11.5–15.5)
WBC: 14.4 10*3/uL — AB (ref 4.0–10.5)

## 2017-09-23 LAB — BASIC METABOLIC PANEL
ANION GAP: 8 (ref 5–15)
BUN: 21 mg/dL — AB (ref 6–20)
CALCIUM: 8.5 mg/dL — AB (ref 8.9–10.3)
CO2: 33 mmol/L — ABNORMAL HIGH (ref 22–32)
Chloride: 104 mmol/L (ref 101–111)
Creatinine, Ser: 0.43 mg/dL — ABNORMAL LOW (ref 0.61–1.24)
GFR calc Af Amer: >60 mL/min (ref >60)
GLUCOSE: 120 mg/dL — AB (ref 65–99)
Potassium: 3.2 mmol/L — ABNORMAL LOW (ref 3.5–5.1)
SODIUM: 145 mmol/L (ref 135–145)

## 2017-09-23 NOTE — Progress Notes (Signed)
Pulmonary [place]   PULMONARY SERVICE  PROGRESS NOTE  Date of Service: 09/23/2017  Dwayne Griffin  OAC:[id number]  DOB: 01/24/1941   DOA: 09/24/2017  Referring Physician: Merton Border, MD  HPI: Dwayne Griffin is a 77 y.o. male seen for follow up of Acute on Chronic Respiratory Failure.  Patient remains on pressure support weaning goal is for 16 hours today doing well so far.  Medications: Reviewed on Rounds  Physical Exam:  Vitals: Temperature 98.5 pulse 92 respiratory rate 20 blood pressure 127/67 saturations 96%  Ventilator Settings mode of ventilation pressure support FiO2 80% tidal volume 580 pressure support 12 PEEP 5  . General: Comfortable at this time . Eyes: Grossly normal lids, irises & conjunctiva . ENT: grossly tongue is normal . Neck: no obvious mass . Cardiovascular: S1 S2 normal no gallop . Respiratory: No rhonchi expansion is equal . Abdomen: soft . Skin: no rash seen on limited exam . Musculoskeletal: not rigid . Psychiatric:unable to assess . Neurologic: no seizure no involuntary movements         Lab Data:   Basic Metabolic Panel: Recent Labs  Lab 09/18/17 0800 09/20/17 0759 09/23/17 0708  NA 147* 149* 145  K 3.6 3.4* 3.2*  CL 106 107 104  CO2 35* 36* 33*  GLUCOSE 348* 131* 120*  BUN 40* 36* 21*  CREATININE 0.62 0.58* 0.43*  CALCIUM 8.8* 9.1 8.5*    Liver Function Tests: No results for input(s): AST, ALT, ALKPHOS, BILITOT, PROT, ALBUMIN in the last 168 hours. No results for input(s): LIPASE, AMYLASE in the last 168 hours. No results for input(s): AMMONIA in the last 168 hours.  CBC: Recent Labs  Lab 09/20/17 0759 09/23/17 0708  WBC 15.1* 14.4*  HGB 9.4* 10.0*  HCT 32.1* 33.3*  MCV 92.5 91.0  PLT 240 267    Cardiac Enzymes: No results for input(s): CKTOTAL, CKMB, CKMBINDEX, TROPONINI in the last 168 hours.  BNP (last 3 results) No results for input(s): BNP in the last 8760  hours.  ProBNP (last 3 results) No results for input(s): PROBNP in the last 8760 hours.  Radiological Exams: No results found.  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 Acute on chronic respiratory failure with hypoxia we will continue with pressure support mode wean as indicated above for 16 hours today. 2. Chronic atrial fibrillation rate is controlled we will follow 3. COPD severe disease 4. Sepsis due to MRSA treated we will monitor 5. Healthcare associated pneumonia continue present management   I have personally seen and evaluated the patient, evaluated laboratory and imaging results, formulated the assessment and plan and placed orders. The Patient requires high complexity decision making for assessment and support.  Case was discussed on Rounds with the Respiratory Therapy Staff  Allyne Gee, MD [facility] Pulmonary Critical Care Medicine Sleep Medicine

## 2017-09-24 ENCOUNTER — Other Ambulatory Visit (HOSPITAL_COMMUNITY): Payer: Self-pay

## 2017-09-24 DIAGNOSIS — J189 Pneumonia, unspecified organism: Secondary | ICD-10-CM | POA: Diagnosis not present

## 2017-09-24 DIAGNOSIS — J9621 Acute and chronic respiratory failure with hypoxia: Secondary | ICD-10-CM | POA: Diagnosis not present

## 2017-09-24 DIAGNOSIS — I482 Chronic atrial fibrillation: Secondary | ICD-10-CM | POA: Diagnosis not present

## 2017-09-24 DIAGNOSIS — J449 Chronic obstructive pulmonary disease, unspecified: Secondary | ICD-10-CM | POA: Diagnosis not present

## 2017-09-24 LAB — CBC
HCT: 25.2 % — ABNORMAL LOW (ref 39.0–52.0)
Hemoglobin: 7.7 g/dL — ABNORMAL LOW (ref 13.0–17.0)
MCH: 27.2 pg (ref 26.0–34.0)
MCHC: 30.6 g/dL (ref 30.0–36.0)
MCV: 89 fL (ref 78.0–100.0)
Platelets: 284 10*3/uL (ref 150–400)
RBC: 2.83 MIL/uL — AB (ref 4.22–5.81)
RDW: 19.5 % — ABNORMAL HIGH (ref 11.5–15.5)
WBC: 17.2 10*3/uL — ABNORMAL HIGH (ref 4.0–10.5)

## 2017-09-24 LAB — CULTURE, BLOOD (ROUTINE X 2)
Culture: NO GROWTH
Culture: NO GROWTH
SPECIAL REQUESTS: ADEQUATE
Special Requests: ADEQUATE

## 2017-09-24 LAB — BASIC METABOLIC PANEL
Anion gap: 6 (ref 5–15)
BUN: 16 mg/dL (ref 6–20)
CO2: 34 mmol/L — ABNORMAL HIGH (ref 22–32)
Calcium: 8.1 mg/dL — ABNORMAL LOW (ref 8.9–10.3)
Chloride: 101 mmol/L (ref 101–111)
Creatinine, Ser: 0.42 mg/dL — ABNORMAL LOW (ref 0.61–1.24)
GFR calc Af Amer: >60 mL/min (ref >60)
GLUCOSE: 156 mg/dL — AB (ref 65–99)
POTASSIUM: 2.7 mmol/L — AB (ref 3.5–5.1)
Sodium: 141 mmol/L (ref 135–145)

## 2017-09-24 LAB — MAGNESIUM: Magnesium: 1.5 mg/dL — ABNORMAL LOW (ref 1.7–2.4)

## 2017-09-24 MED ORDER — IOHEXOL 300 MG/ML  SOLN
100.0000 mL | Freq: Once | INTRAMUSCULAR | Status: AC | PRN
  Administered 2017-09-24: 100 mL via INTRAVENOUS

## 2017-09-24 NOTE — Progress Notes (Signed)
Pulmonary [place]   PULMONARY SERVICE  PROGRESS NOTE  Date of Service: 09/24/2017  Dwayne Griffin  VOJ:[id number]  DOB: 04-05-1941   DOA: 09/19/2017  Referring Physician: Merton Border, MD  HPI: Dwayne Griffin is a 77 y.o. male seen for follow up of Acute on Chronic Respiratory Failure.  Patient right now is on full vent support was started on antibiotics had been having some fevers.  Chest x-ray showing some pneumonitis still present on the CT of the chest  Medications: Reviewed on Rounds  Physical Exam:  Vitals: Temperature 98.2 pulse 77 respiratory rate 23 blood pressure 130/49 saturations 97%  Ventilator Settings mode of ventilation assist control FiO2 40% tidal volume 456 PEEP 5  . General: Comfortable at this time . Eyes: Grossly normal lids, irises & conjunctiva . ENT: grossly tongue is normal . Neck: no obvious mass . Cardiovascular: S1 S2 normal no gallop . Respiratory: Coarse breath sounds noted bilaterally . Abdomen: soft . Skin: no rash seen on limited exam . Musculoskeletal: not rigid . Psychiatric:unable to assess . Neurologic: no seizure no involuntary movements         Lab Data:   Basic Metabolic Panel: Recent Labs  Lab 09/18/17 0800 09/20/17 0759 09/23/17 0708 09/24/17 0646  NA 147* 149* 145 141  K 3.6 3.4* 3.2* 2.7*  CL 106 107 104 101  CO2 35* 36* 33* 34*  GLUCOSE 348* 131* 120* 156*  BUN 40* 36* 21* 16  CREATININE 0.62 0.58* 0.43* 0.42*  CALCIUM 8.8* 9.1 8.5* 8.1*  MG  --   --   --  1.5*    Liver Function Tests: No results for input(s): AST, ALT, ALKPHOS, BILITOT, PROT, ALBUMIN in the last 168 hours. No results for input(s): LIPASE, AMYLASE in the last 168 hours. No results for input(s): AMMONIA in the last 168 hours.  CBC: Recent Labs  Lab 09/20/17 0759 09/23/17 0708 09/24/17 0646  WBC 15.1* 14.4* 17.2*  HGB 9.4* 10.0* 7.7*  HCT 32.1* 33.3* 25.2*  MCV 92.5 91.0 89.0  PLT 240 267 284     Cardiac Enzymes: No results for input(s): CKTOTAL, CKMB, CKMBINDEX, TROPONINI in the last 168 hours.  BNP (last 3 results) No results for input(s): BNP in the last 8760 hours.  ProBNP (last 3 results) No results for input(s): PROBNP in the last 8760 hours.  Radiological Exams: Ct Abdomen Pelvis Wo Contrast  Result Date: 09/24/2017 CLINICAL DATA:  Evaluate for aspiration. Followup for consideration of peg placement. History of lung cancer. EXAM: CT CHEST, ABDOMEN AND PELVIS WITHOUT CONTRAST TECHNIQUE: Multidetector CT imaging of the chest, abdomen and pelvis was performed following the standard protocol without IV contrast. COMPARISON:  None FINDINGS: CT CHEST FINDINGS Cardiovascular: The heart size appears normal. Aortic atherosclerosis. Calcifications in the LAD, left circumflex and RCA coronary artery noted. 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 A tracheostomy tube tip is above the carina. Mildly dilated and fluid-filled esophagus containing a nasogastric tube is identified. The tip of the tube is in the body of the stomach. No enlarged mediastinal or hilar lymph nodes. Lungs/Pleura: There are small bilateral pleural effusions identified left greater than right. Mild lower lobe predominant interlobular septal thickening is noted compatible with pulmonary edema. Postoperative changes and volume loss within the right lung identified suggesting prior partial lobectomies from the right upper lobe and right middle lobe. Moderate changes of centrilobular emphysema noted with diffuse bronchial  wall thickening noted. Airspace consolidation and passive atelectasis is identified within both lower lobes. Within the right upper lobe there is a area of masslike architectural distortion which has a geographic distribution, image 56/4. The appearance is suggestive of changes due to external beam radiation. Masslike architectural  distortion within the left lung apex is noted which may be due to chronic scarring and/or infection. Multiple tiny nodules are identified throughout the right lung and left lower lobe. Some of these have a tree-in-bud configuration favoring an inflammatory or infectious process. Others are less specific including a 1.1 cm cavitary nodule within the lateral right lower lobe, image 89/4. Non cavitary solid nodule in the right base measures 8 mm, image 105/4. In the left lower lobe there is a solid non cavitary nodule measuring 7 mm, image 90/4. Scattered small ground-glass attenuating nodules noted predominantly involving the right lung. Musculoskeletal: Multi level spondylosis identified throughout the thoracic spine. Chronic fracture deformity involving the proximal body of sternum noted. Previous left thoracotomy with screw and plate fixation of the right fifth rib posteriorly. CT ABDOMEN PELVIS FINDINGS Hepatobiliary: No focal liver abnormalities. The gallbladder appears normal. No biliary dilatation. Pancreas: The pancreas appears atrophic and partially fatty replaced with scattered calcifications suggestive of chronic pancreatitis. Indeterminate nodule along the anterior surface of the head of pancreas measures 2.0 cm, image 72/3. Unchanged from previous exam. Spleen: Normal in size without focal abnormality. Adrenals/Urinary Tract: Normal adrenal glands. No kidney mass or hydronephrosis identified. 9 mm area of hyper attenuation within the inferior pole of left kidney is identified, incompletely characterized without IV contrast. The urinary bladder contains gas and is partially collapsed around a Foley catheter balloon. Stomach/Bowel: Stomach is nondistended. The small bowel loops have a normal caliber. Unremarkable appearance of the proximal colon. Distal colonic diverticula noted without acute inflammation. Vascular/Lymphatic: Aortic atherosclerosis including branch vessel disease. No aneurysm. No adenopathy  identified within the abdomen. No pelvic or inguinal adenopathy. Reproductive: Prostate is unremarkable. Other: No free fluid or fluid collections. Musculoskeletal: Degenerative disc disease identified within the lumbar spine. No suspicious osseous lesions identified. IMPRESSION: 1. Bilateral pleural effusions with overlying areas of atelectasis and airspace consolidation noted. The airspace consolidation may reflect pneumonia and/or aspiration. Atelectasis is likely passive. 2. Postoperative change and volume loss from the right middle lobe and right upper lobe. A geographic area of masslike architectural distortion within the right upper lobe suggest sequelae of prior external beam radiation. 3. Scattered nonspecific nodules are identified within both lungs. Many of these can have are either ground-glass or tree-in-bud in configuration favoring with an inflammatory or infectious bronchiolitis. Others are either solid or have a cavitary appearance and are less specific. Well these may also be inflammatory or infectious in etiology metastatic disease in this patient who has a history of Lung cancer cannot be excluded. Comparison with any available outside studies may be helpful. 4. Sequelae of chronic pancreatitis. Along the anterior aspect of the head of pancreas there is a rounded nodule which is nonspecific. This may represent a peripancreatic lymph node or lesion arising from within the head of pancreas. More definitive assessment with nonemergent contrast enhanced MRI would be helpful for further characterization. 5. Extensive aortic atherosclerosis and 3 vessel coronary artery atherosclerotic calcifications. Aortic Atherosclerosis (ICD10-I70.0). 6. Diffuse bronchial wall thickening with emphysema, as above; imaging findings suggestive of underlying COPD. Emphysema (ICD10-J43.9). 7. Electronically Signed   By: Kerby Moors M.D.   On: 09/24/2017 14:28   Ct Chest Wo Contrast  Result Date:  09/24/2017 CLINICAL DATA:  Evaluate for aspiration. Followup for consideration of peg placement. History of lung cancer. EXAM: CT CHEST, ABDOMEN AND PELVIS WITHOUT CONTRAST TECHNIQUE: Multidetector CT imaging of the chest, abdomen and pelvis was performed following the standard protocol without IV contrast. COMPARISON:  None FINDINGS: CT CHEST FINDINGS Cardiovascular: The heart size appears normal. Aortic atherosclerosis. Calcifications in the LAD, left circumflex and RCA coronary artery noted. 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 A tracheostomy tube tip is above the carina. Mildly dilated and fluid-filled esophagus containing a nasogastric tube is identified. The tip of the tube is in the body of the stomach. No enlarged mediastinal or hilar lymph nodes. Lungs/Pleura: There are small bilateral pleural effusions identified left greater than right. Mild lower lobe predominant interlobular septal thickening is noted compatible with pulmonary edema. Postoperative changes and volume loss within the right lung identified suggesting prior partial lobectomies from the right upper lobe and right middle lobe. Moderate changes of centrilobular emphysema noted with diffuse bronchial wall thickening noted. Airspace consolidation and passive atelectasis is identified within both lower lobes. Within the right upper lobe there is a area of masslike architectural distortion which has a geographic distribution, image 56/4. The appearance is suggestive of changes due to external beam radiation. Masslike architectural distortion within the left lung apex is noted which may be due to chronic scarring and/or infection. Multiple tiny nodules are identified throughout the right lung and left lower lobe. Some of these have a tree-in-bud configuration favoring an inflammatory or infectious process. Others are less specific including a 1.1 cm cavitary nodule  within the lateral right lower lobe, image 89/4. Non cavitary solid nodule in the right base measures 8 mm, image 105/4. In the left lower lobe there is a solid non cavitary nodule measuring 7 mm, image 90/4. Scattered small ground-glass attenuating nodules noted predominantly involving the right lung. Musculoskeletal: Multi level spondylosis identified throughout the thoracic spine. Chronic fracture deformity involving the proximal body of sternum noted. Previous left thoracotomy with screw and plate fixation of the right fifth rib posteriorly. CT ABDOMEN PELVIS FINDINGS Hepatobiliary: No focal liver abnormalities. The gallbladder appears normal. No biliary dilatation. Pancreas: The pancreas appears atrophic and partially fatty replaced with scattered calcifications suggestive of chronic pancreatitis. Indeterminate nodule along the anterior surface of the head of pancreas measures 2.0 cm, image 72/3. Unchanged from previous exam. Spleen: Normal in size without focal abnormality. Adrenals/Urinary Tract: Normal adrenal glands. No kidney mass or hydronephrosis identified. 9 mm area of hyper attenuation within the inferior pole of left kidney is identified, incompletely characterized without IV contrast. The urinary bladder contains gas and is partially collapsed around a Foley catheter balloon. Stomach/Bowel: Stomach is nondistended. The small bowel loops have a normal caliber. Unremarkable appearance of the proximal colon. Distal colonic diverticula noted without acute inflammation. Vascular/Lymphatic: Aortic atherosclerosis including branch vessel disease. No aneurysm. No adenopathy identified within the abdomen. No pelvic or inguinal adenopathy. Reproductive: Prostate is unremarkable. Other: No free fluid or fluid collections. Musculoskeletal: Degenerative disc disease identified within the lumbar spine. No suspicious osseous lesions identified. IMPRESSION: 1. Bilateral pleural effusions with overlying areas of  atelectasis and airspace consolidation noted. The airspace consolidation may reflect pneumonia and/or aspiration. Atelectasis is likely passive. 2. Postoperative change and volume loss from the right middle lobe and right upper lobe. A geographic area of masslike architectural distortion within the right upper lobe suggest sequelae of prior external beam radiation. 3. Scattered  nonspecific nodules are identified within both lungs. Many of these can have are either ground-glass or tree-in-bud in configuration favoring with an inflammatory or infectious bronchiolitis. Others are either solid or have a cavitary appearance and are less specific. Well these may also be inflammatory or infectious in etiology metastatic disease in this patient who has a history of Lung cancer cannot be excluded. Comparison with any available outside studies may be helpful. 4. Sequelae of chronic pancreatitis. Along the anterior aspect of the head of pancreas there is a rounded nodule which is nonspecific. This may represent a peripancreatic lymph node or lesion arising from within the head of pancreas. More definitive assessment with nonemergent contrast enhanced MRI would be helpful for further characterization. 5. Extensive aortic atherosclerosis and 3 vessel coronary artery atherosclerotic calcifications. Aortic Atherosclerosis (ICD10-I70.0). 6. Diffuse bronchial wall thickening with emphysema, as above; imaging findings suggestive of underlying COPD. Emphysema (ICD10-J43.9). 7. Electronically Signed   By: Kerby Moors M.D.   On: 09/24/2017 14:28    Assessment/Plan Principal Problem:   Acute on chronic respiratory failure with hypoxemia (HCC) Active Problems:   Chronic atrial fibrillation (HCC)   COPD (chronic obstructive pulmonary disease) (HCC)   Sepsis due to methicillin resistant Staphylococcus aureus (MRSA) (HCC)   Healthcare-associated pneumonia   1. Acute on chronic respiratory failure with hypoxia we will continue  with full vent support not weaning today continue pulmonary toilet secretion management. 2. Lobar pneumonia CT scan still showing significant infiltrate.  Patient is currently on Cipro which will be continued continue aggressive pulmonary toilet supportive care. 3. Chronic atrial fibrillation rate is controlled we will continue to follow. 4. COPD severe disease continue present management 5. Sepsis due to MRSA need to review the antibiotics make sure this is covered 6. Healthcare associated pneumonia treated with IV antibiotics we will continue to follow   I have personally seen and evaluated the patient, evaluated laboratory and imaging results, formulated the assessment and plan and placed orders. The Patient requires high complexity decision making for assessment and support.  Case was discussed on Rounds with the Respiratory Therapy Staff  Allyne Gee, MD [facility] Pulmonary Critical Care Medicine Sleep Medicine

## 2017-09-26 DIAGNOSIS — I482 Chronic atrial fibrillation: Secondary | ICD-10-CM | POA: Diagnosis not present

## 2017-09-26 DIAGNOSIS — J189 Pneumonia, unspecified organism: Secondary | ICD-10-CM | POA: Diagnosis not present

## 2017-09-26 DIAGNOSIS — J449 Chronic obstructive pulmonary disease, unspecified: Secondary | ICD-10-CM | POA: Diagnosis not present

## 2017-09-26 DIAGNOSIS — A4102 Sepsis due to Methicillin resistant Staphylococcus aureus: Secondary | ICD-10-CM | POA: Diagnosis not present

## 2017-09-26 DIAGNOSIS — J9621 Acute and chronic respiratory failure with hypoxia: Secondary | ICD-10-CM | POA: Diagnosis not present

## 2017-09-26 NOTE — Progress Notes (Signed)
[facility]   PULMONARY SERVICE  PROGRESS NOTE  Date of Service: 09/26/2017  Dwayne Griffin  WCB:[id number]  DOB: 1940-11-03   DOA: 10/06/2017  Referring Physician: Merton Border, MD  HPI: Dwayne Griffin is a 77 y.o. male seen for follow up of Acute on Chronic Respiratory Failure.  Patient remains on the ventilator apparently the family was deciding on making him comfort care and DNR will need to follow-up with the primary care physician team regarding this.  Right now remains on the ventilator and full support.  Patient has not been able to do any meaningful weaning today.  Medications: Reviewed on Rounds  Physical Exam:  Vitals: Temperature 98.4 pulse 118 respiratory rate 30 blood pressure 148/62 saturations 95%  Ventilator Settings mode of ventilation assist control FiO2 40% tidal volume 520 PEEP 5  . General: Comfortable at this time . Eyes: Grossly normal lids, irises & conjunctiva . ENT: grossly tongue is normal . Neck: no obvious mass . Cardiovascular: S1 S2 normal no gallop . Respiratory: No rhonchi noted . Abdomen: soft . Skin: no rash seen on limited exam . Musculoskeletal: not rigid . Psychiatric:unable to assess . Neurologic: no seizure no involuntary movements         Lab Data:   Basic Metabolic Panel: Recent Labs  Lab 09/20/17 0759 09/23/17 0708 09/24/17 0646  NA 149* 145 141  K 3.4* 3.2* 2.7*  CL 107 104 101  CO2 36* 33* 34*  GLUCOSE 131* 120* 156*  BUN 36* 21* 16  CREATININE 0.58* 0.43* 0.42*  CALCIUM 9.1 8.5* 8.1*  MG  --   --  1.5*    Liver Function Tests: No results for input(s): AST, ALT, ALKPHOS, BILITOT, PROT, ALBUMIN in the last 168 hours. No results for input(s): LIPASE, AMYLASE in the last 168 hours. No results for input(s): AMMONIA in the last 168 hours.  CBC: Recent Labs  Lab 09/20/17 0759 09/23/17 0708 09/24/17 0646  WBC 15.1* 14.4* 17.2*  HGB 9.4* 10.0* 7.7*  HCT 32.1*  33.3* 25.2*  MCV 92.5 91.0 89.0  PLT 240 267 284    Cardiac Enzymes: No results for input(s): CKTOTAL, CKMB, CKMBINDEX, TROPONINI in the last 168 hours.  BNP (last 3 results) No results for input(s): BNP in the last 8760 hours.  ProBNP (last 3 results) No results for input(s): PROBNP in the last 8760 hours.  Radiological Exams: Ct Abdomen Pelvis Wo Contrast  Result Date: 09/24/2017 CLINICAL DATA:  Evaluate for aspiration. Followup for consideration of peg placement. History of lung cancer. EXAM: CT CHEST, ABDOMEN AND PELVIS WITHOUT CONTRAST TECHNIQUE: Multidetector CT imaging of the chest, abdomen and pelvis was performed following the standard protocol without IV contrast. COMPARISON:  None FINDINGS: CT CHEST FINDINGS Cardiovascular: The heart size appears normal. Aortic atherosclerosis. Calcifications in the LAD, left circumflex and RCA coronary artery noted. 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 A tracheostomy tube tip is above the carina. Mildly dilated and fluid-filled esophagus containing a nasogastric tube is identified. The tip of the tube is in the body of the stomach. No enlarged mediastinal or hilar lymph nodes. Lungs/Pleura: There are small bilateral pleural effusions identified left greater than right. Mild lower lobe predominant interlobular septal thickening is noted compatible with pulmonary edema. Postoperative changes and volume loss within the right lung identified suggesting prior partial lobectomies from the right upper lobe and right middle lobe. Moderate changes of centrilobular  emphysema noted with diffuse bronchial wall thickening noted. Airspace consolidation and passive atelectasis is identified within both lower lobes. Within the right upper lobe there is a area of masslike architectural distortion which has a geographic distribution, image 56/4. The appearance is suggestive of changes due to  external beam radiation. Masslike architectural distortion within the left lung apex is noted which may be due to chronic scarring and/or infection. Multiple tiny nodules are identified throughout the right lung and left lower lobe. Some of these have a tree-in-bud configuration favoring an inflammatory or infectious process. Others are less specific including a 1.1 cm cavitary nodule within the lateral right lower lobe, image 89/4. Non cavitary solid nodule in the right base measures 8 mm, image 105/4. In the left lower lobe there is a solid non cavitary nodule measuring 7 mm, image 90/4. Scattered small ground-glass attenuating nodules noted predominantly involving the right lung. Musculoskeletal: Multi level spondylosis identified throughout the thoracic spine. Chronic fracture deformity involving the proximal body of sternum noted. Previous left thoracotomy with screw and plate fixation of the right fifth rib posteriorly. CT ABDOMEN PELVIS FINDINGS Hepatobiliary: No focal liver abnormalities. The gallbladder appears normal. No biliary dilatation. Pancreas: The pancreas appears atrophic and partially fatty replaced with scattered calcifications suggestive of chronic pancreatitis. Indeterminate nodule along the anterior surface of the head of pancreas measures 2.0 cm, image 72/3. Unchanged from previous exam. Spleen: Normal in size without focal abnormality. Adrenals/Urinary Tract: Normal adrenal glands. No kidney mass or hydronephrosis identified. 9 mm area of hyper attenuation within the inferior pole of left kidney is identified, incompletely characterized without IV contrast. The urinary bladder contains gas and is partially collapsed around a Foley catheter balloon. Stomach/Bowel: Stomach is nondistended. The small bowel loops have a normal caliber. Unremarkable appearance of the proximal colon. Distal colonic diverticula noted without acute inflammation. Vascular/Lymphatic: Aortic atherosclerosis including  branch vessel disease. No aneurysm. No adenopathy identified within the abdomen. No pelvic or inguinal adenopathy. Reproductive: Prostate is unremarkable. Other: No free fluid or fluid collections. Musculoskeletal: Degenerative disc disease identified within the lumbar spine. No suspicious osseous lesions identified. IMPRESSION: 1. Bilateral pleural effusions with overlying areas of atelectasis and airspace consolidation noted. The airspace consolidation may reflect pneumonia and/or aspiration. Atelectasis is likely passive. 2. Postoperative change and volume loss from the right middle lobe and right upper lobe. A geographic area of masslike architectural distortion within the right upper lobe suggest sequelae of prior external beam radiation. 3. Scattered nonspecific nodules are identified within both lungs. Many of these can have are either ground-glass or tree-in-bud in configuration favoring with an inflammatory or infectious bronchiolitis. Others are either solid or have a cavitary appearance and are less specific. Well these may also be inflammatory or infectious in etiology metastatic disease in this patient who has a history of Lung cancer cannot be excluded. Comparison with any available outside studies may be helpful. 4. Sequelae of chronic pancreatitis. Along the anterior aspect of the head of pancreas there is a rounded nodule which is nonspecific. This may represent a peripancreatic lymph node or lesion arising from within the head of pancreas. More definitive assessment with nonemergent contrast enhanced MRI would be helpful for further characterization. 5. Extensive aortic atherosclerosis and 3 vessel coronary artery atherosclerotic calcifications. Aortic Atherosclerosis (ICD10-I70.0). 6. Diffuse bronchial wall thickening with emphysema, as above; imaging findings suggestive of underlying COPD. Emphysema (ICD10-J43.9). 7. Electronically Signed   By: Kerby Moors M.D.   On: 09/24/2017 14:28   Ct  Chest Wo Contrast  Result Date: 09/24/2017 CLINICAL DATA:  Evaluate for aspiration. Followup for consideration of peg placement. History of lung cancer. EXAM: CT CHEST, ABDOMEN AND PELVIS WITHOUT CONTRAST TECHNIQUE: Multidetector CT imaging of the chest, abdomen and pelvis was performed following the standard protocol without IV contrast. COMPARISON:  None FINDINGS: CT CHEST FINDINGS Cardiovascular: The heart size appears normal. Aortic atherosclerosis. Calcifications in the LAD, left circumflex and RCA coronary artery noted. 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 A tracheostomy tube tip is above the carina. Mildly dilated and fluid-filled esophagus containing a nasogastric tube is identified. The tip of the tube is in the body of the stomach. No enlarged mediastinal or hilar lymph nodes. Lungs/Pleura: There are small bilateral pleural effusions identified left greater than right. Mild lower lobe predominant interlobular septal thickening is noted compatible with pulmonary edema. Postoperative changes and volume loss within the right lung identified suggesting prior partial lobectomies from the right upper lobe and right middle lobe. Moderate changes of centrilobular emphysema noted with diffuse bronchial wall thickening noted. Airspace consolidation and passive atelectasis is identified within both lower lobes. Within the right upper lobe there is a area of masslike architectural distortion which has a geographic distribution, image 56/4. The appearance is suggestive of changes due to external beam radiation. Masslike architectural distortion within the left lung apex is noted which may be due to chronic scarring and/or infection. Multiple tiny nodules are identified throughout the right lung and left lower lobe. Some of these have a tree-in-bud configuration favoring an inflammatory or infectious process. Others are less specific  including a 1.1 cm cavitary nodule within the lateral right lower lobe, image 89/4. Non cavitary solid nodule in the right base measures 8 mm, image 105/4. In the left lower lobe there is a solid non cavitary nodule measuring 7 mm, image 90/4. Scattered small ground-glass attenuating nodules noted predominantly involving the right lung. Musculoskeletal: Multi level spondylosis identified throughout the thoracic spine. Chronic fracture deformity involving the proximal body of sternum noted. Previous left thoracotomy with screw and plate fixation of the right fifth rib posteriorly. CT ABDOMEN PELVIS FINDINGS Hepatobiliary: No focal liver abnormalities. The gallbladder appears normal. No biliary dilatation. Pancreas: The pancreas appears atrophic and partially fatty replaced with scattered calcifications suggestive of chronic pancreatitis. Indeterminate nodule along the anterior surface of the head of pancreas measures 2.0 cm, image 72/3. Unchanged from previous exam. Spleen: Normal in size without focal abnormality. Adrenals/Urinary Tract: Normal adrenal glands. No kidney mass or hydronephrosis identified. 9 mm area of hyper attenuation within the inferior pole of left kidney is identified, incompletely characterized without IV contrast. The urinary bladder contains gas and is partially collapsed around a Foley catheter balloon. Stomach/Bowel: Stomach is nondistended. The small bowel loops have a normal caliber. Unremarkable appearance of the proximal colon. Distal colonic diverticula noted without acute inflammation. Vascular/Lymphatic: Aortic atherosclerosis including branch vessel disease. No aneurysm. No adenopathy identified within the abdomen. No pelvic or inguinal adenopathy. Reproductive: Prostate is unremarkable. Other: No free fluid or fluid collections. Musculoskeletal: Degenerative disc disease identified within the lumbar spine. No suspicious osseous lesions identified. IMPRESSION: 1. Bilateral pleural  effusions with overlying areas of atelectasis and airspace consolidation noted. The airspace consolidation may reflect pneumonia and/or aspiration. Atelectasis is likely passive. 2. Postoperative change and volume loss from the right middle lobe and right upper lobe. A geographic area of masslike architectural distortion within the right upper lobe suggest sequelae of  prior external beam radiation. 3. Scattered nonspecific nodules are identified within both lungs. Many of these can have are either ground-glass or tree-in-bud in configuration favoring with an inflammatory or infectious bronchiolitis. Others are either solid or have a cavitary appearance and are less specific. Well these may also be inflammatory or infectious in etiology metastatic disease in this patient who has a history of Lung cancer cannot be excluded. Comparison with any available outside studies may be helpful. 4. Sequelae of chronic pancreatitis. Along the anterior aspect of the head of pancreas there is a rounded nodule which is nonspecific. This may represent a peripancreatic lymph node or lesion arising from within the head of pancreas. More definitive assessment with nonemergent contrast enhanced MRI would be helpful for further characterization. 5. Extensive aortic atherosclerosis and 3 vessel coronary artery atherosclerotic calcifications. Aortic Atherosclerosis (ICD10-I70.0). 6. Diffuse bronchial wall thickening with emphysema, as above; imaging findings suggestive of underlying COPD. Emphysema (ICD10-J43.9). 7. Electronically Signed   By: Kerby Moors M.D.   On: 09/24/2017 14:28    Assessment/Plan Principal Problem:   Acute on chronic respiratory failure with hypoxemia (HCC) Active Problems:   Chronic atrial fibrillation (HCC)   COPD (chronic obstructive pulmonary disease) (HCC)   Sepsis due to methicillin resistant Staphylococcus aureus (MRSA) (HCC)   Healthcare-associated pneumonia   1. Acute on chronic respiratory  failure with hypoxia we will continue with full vent support need to discuss CODE STATUS we will continue to remain a full code now until the DNR issue is resolved. 2. Chronic atrial fibrillation rate is controlled we will continue to follow 3. COPD severe disease prognosis guarded 4. Sepsis due to MRSA treated with antibiotics 5. Healthcare associated pneumonia treated with antibiotics we will continue to follow   I have personally seen and evaluated the patient, evaluated laboratory and imaging results, formulated the assessment and plan and placed orders. The Patient requires high complexity decision making for assessment and support.  Case was discussed on Rounds with the Respiratory Therapy Staff  Allyne Gee, MD [facility] Pulmonary Critical Care Medicine Sleep Medicine

## 2017-09-27 LAB — MAGNESIUM: MAGNESIUM: 1.5 mg/dL — AB (ref 1.7–2.4)

## 2017-09-27 LAB — CBC
HEMATOCRIT: 25.5 % — AB (ref 39.0–52.0)
HEMOGLOBIN: 8.1 g/dL — AB (ref 13.0–17.0)
MCH: 27.7 pg (ref 26.0–34.0)
MCHC: 31.8 g/dL (ref 30.0–36.0)
MCV: 87.3 fL (ref 78.0–100.0)
Platelets: 267 10*3/uL (ref 150–400)
RBC: 2.92 MIL/uL — ABNORMAL LOW (ref 4.22–5.81)
RDW: 19.9 % — ABNORMAL HIGH (ref 11.5–15.5)
WBC: 15.3 10*3/uL — ABNORMAL HIGH (ref 4.0–10.5)

## 2017-09-27 LAB — BASIC METABOLIC PANEL
Anion gap: 9 (ref 5–15)
BUN: 10 mg/dL (ref 6–20)
CHLORIDE: 96 mmol/L — AB (ref 101–111)
CO2: 29 mmol/L (ref 22–32)
Calcium: 8.2 mg/dL — ABNORMAL LOW (ref 8.9–10.3)
Creatinine, Ser: 0.52 mg/dL — ABNORMAL LOW (ref 0.61–1.24)
GFR calc Af Amer: >60 mL/min (ref >60)
GFR calc non Af Amer: >60 mL/min (ref >60)
GLUCOSE: 114 mg/dL — AB (ref 65–99)
POTASSIUM: 2.9 mmol/L — AB (ref 3.5–5.1)
Sodium: 134 mmol/L — ABNORMAL LOW (ref 135–145)

## 2017-09-29 LAB — BASIC METABOLIC PANEL
ANION GAP: 5 (ref 5–15)
BUN: 12 mg/dL (ref 6–20)
CALCIUM: 8.2 mg/dL — AB (ref 8.9–10.3)
CHLORIDE: 100 mmol/L — AB (ref 101–111)
CO2: 29 mmol/L (ref 22–32)
Creatinine, Ser: 0.37 mg/dL — ABNORMAL LOW (ref 0.61–1.24)
GFR calc Af Amer: >60 mL/min (ref >60)
GFR calc non Af Amer: >60 mL/min (ref >60)
GLUCOSE: 139 mg/dL — AB (ref 65–99)
POTASSIUM: 3.1 mmol/L — AB (ref 3.5–5.1)
Sodium: 134 mmol/L — ABNORMAL LOW (ref 135–145)

## 2017-09-29 LAB — MAGNESIUM: Magnesium: 1.7 mg/dL (ref 1.7–2.4)

## 2017-09-29 NOTE — H&P (Signed)
Chief Complaint: Patient was seen in consultation today for dyspahgia  Referring Physician(s): Dr. Laren Everts  Supervising Physician: Sandi Mariscal  Patient Status: [facility] - In-pt  History of Present Illness: Dwayne Griffin is a 77 y.o. male with past medical history of lung cancer, COPD, who was admitted to OSH with dry gangrene.  He underwent BKA of the LLE and subsequently developed acute respiratory failure now s/p tracheostomy still requiring vent support. Patient transferred to [facility] for ongoing care.  He has poor wound healing to his BKA stump.  He has sepsis due to MRSA pneumonia. Respiratory cultures from 6/19 also positive for Pseudomonas.   IR consulted for possible gastrostomy tube placement at the request of Dr. Laren Everts.   CT Abdomen obtained 09/21/17 which showed: Moderately distended and redundant colon containing moderate amount of fecal material and draped around much of the stomach. This places the patient at higher risk for colonic injury with percutaneous gastrostomy placement. A bowel prep may be helpful to decompress the colon and may make gastrostomy tube placement more feasible and reduce risk.  Past Medical History:  Diagnosis Date  . Acute on chronic respiratory failure with hypoxemia ([place])   . Chronic atrial fibrillation ([place])   . COPD (chronic obstructive pulmonary disease) ([place])   . Coronary artery disease   . Dry gangrene ([place])   . Healthcare-associated pneumonia 09/01/2017  . Lung cancer ([place])   . Sepsis due to methicillin resistant Staphylococcus aureus (MRSA) ([place]) 09/01/2017  . Supraventricular tachycardia [facility])     Past Surgical History:  Procedure Laterality Date  . Amputation left lower extremity    . Central line placement    . Right upper lobectomy    . TRACHEOSTOMY TUBE PLACEMENT N/A 09/11/2017   Procedure: TRACHEOSTOMY;  Surgeon: Rozetta Nunnery, MD;  Location: [place];  Service: ENT;  Laterality: N/A;     Allergies: Patient has no allergy information on record.  Medications: Prior to Admission medications   Medication Sig Start Date End Date Taking? Authorizing Provider  Celedonio Miyamoto 62.5-25 MCG/INH AEPB  07/29/17   [provider]  atorvastatin (LIPITOR) 40 MG tablet Take 40 mg by mouth daily. 08/15/17   [provider]  clindamycin (CLEOCIN T) 1 % external solution  07/28/17   [provider]  clopidogrel (PLAVIX) 75 MG tablet Take 75 mg by mouth daily. 07/29/17   [provider]  ELIQUIS 5 MG TABS tablet Take 5 mg by mouth 2 (two) times daily. 07/29/17   [provider]  fentaNYL (DURAGESIC - DOSED MCG/HR) 12 MCG/HR Place 12 mcg onto the skin every 3 (three) days. 08/01/17   [provider]  fluocinonide (LIDEX) 0.05 % external solution  07/28/17   [provider]  gentamicin cream (GARAMYCIN) 0.1 %  06/23/17   [provider]  glyBURIDE-metformin (GLUCOVANCE) 5-500 MG tablet  06/23/17   [provider]  HYDROcodone-acetaminophen (NORCO/VICODIN) 5-325 MG tablet Take 1 tablet by mouth every 6 (six) hours as needed for pain. 08/08/17   [provider]  indapamide (LOZOL) 2.5 MG tablet Take 2.5 mg by mouth daily. 07/09/17   [provider]  LEVEMIR FLEXTOUCH 100 UNIT/ML Pen  07/29/17   [provider]  levofloxacin [place]) 25 MG/ML injection  07/28/17   [provider]  mupirocin cream (BACTROBAN) 2 %  06/23/17   [provider]  SANTYL ointment  07/27/17   [provider]  sodium hypochlorite (DAKIN'S 1/4 STRENGTH) 0.125 % SOLN  07/15/17   [provider]  spironolactone (ALDACTONE) 25 MG tablet Take 25 mg by mouth daily. 08/18/17   [provider]     Family History  Family history unknown: Yes    Social History   Socioeconomic History  . Marital status: Married    Spouse name: Not on file  . Number of children: Not on file  . Years of  education: Not on file  . Highest education level: Not on file  Occupational History  . Not on file  Social Needs  . Financial resource strain: Not on file  . Food insecurity:    Worry: Not on file    Inability: Not on file  . Transportation needs:    Medical: Not on file    Non-medical: Not on file  Tobacco Use  . Smoking status: Former Research scientist (life sciences)  . Smokeless tobacco: Never Used  Substance and Sexual Activity  . Alcohol use: Not Currently  . Drug use: Not Currently  . Sexual activity: Not Currently  Lifestyle  . Physical activity:    Days per week: Not on file    Minutes per session: Not on file  . Stress: Not on file  Relationships  . Social connections:    Talks on phone: Not on file    Gets together: Not on file    Attends religious service: Not on file    Active member of club or organization: Not on file    Attends meetings of clubs or organizations: Not on file    Relationship status: Not on file  Other Topics Concern  . Not on file  Social History Narrative  . Not on file     Review of Systems: A 12 point ROS discussed and pertinent positives are indicated in the HPI above.  All other systems are negative.  Review of Systems  Unable to perform ROS: Acuity of condition    Vital Signs: There were no vitals taken for this visit.  Physical Exam  Constitutional: He appears well-developed.  Cardiovascular: Normal rate, regular rhythm and normal heart sounds.  Pulmonary/Chest: Effort normal and breath sounds normal. No respiratory distress.  Abdominal: Soft. He exhibits no distension. There is no tenderness.  Neurological: He is alert.  Attempts to communicate, however intelligible due to trach  Skin: Skin is warm and dry.  Psychiatric: He has a normal mood and affect. His behavior is normal. Judgment and thought content normal.  Nursing note and vitals reviewed.   MD Evaluation Airway: Other (comments) Airway comments: trach/vent Heart: WNL Abdomen:  WNL Chest/ Lungs: WNL ASA  Classification: 3 Mallampati/Airway Score: Three   Imaging: Ct Abdomen Pelvis Wo Contrast  Result Date: 09/24/2017 CLINICAL DATA:  Evaluate for aspiration. Followup for consideration of peg placement. History of lung cancer. EXAM: CT CHEST, ABDOMEN AND PELVIS WITHOUT CONTRAST TECHNIQUE: Multidetector CT imaging of the chest, abdomen and pelvis was performed following the standard protocol without IV contrast. COMPARISON:  None FINDINGS: CT CHEST FINDINGS Cardiovascular: The heart size appears normal. Aortic atherosclerosis. Calcifications in the LAD, left circumflex and RCA coronary artery noted. 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 A tracheostomy tube tip is above the carina. Mildly dilated and fluid-filled esophagus containing a nasogastric tube is identified. The tip of the tube is in the body of the stomach. No enlarged mediastinal or hilar lymph nodes. Lungs/Pleura: There are small bilateral pleural effusions identified left greater than right. Mild lower lobe predominant interlobular  septal thickening is noted compatible with pulmonary edema. Postoperative changes and volume loss within the right lung identified suggesting prior partial lobectomies from the right upper lobe and right middle lobe. Moderate changes of centrilobular emphysema noted with diffuse bronchial wall thickening noted. Airspace consolidation and passive atelectasis is identified within both lower lobes. Within the right upper lobe there is a area of masslike architectural distortion which has a geographic distribution, image 56/4. The appearance is suggestive of changes due to external beam radiation. Masslike architectural distortion within the left lung apex is noted which may be due to chronic scarring and/or infection. Multiple tiny nodules are identified throughout the right lung and left lower lobe. Some of these have  a tree-in-bud configuration favoring an inflammatory or infectious process. Others are less specific including a 1.1 cm cavitary nodule within the lateral right lower lobe, image 89/4. Non cavitary solid nodule in the right base measures 8 mm, image 105/4. In the left lower lobe there is a solid non cavitary nodule measuring 7 mm, image 90/4. Scattered small ground-glass attenuating nodules noted predominantly involving the right lung. Musculoskeletal: Multi level spondylosis identified throughout the thoracic spine. Chronic fracture deformity involving the proximal body of sternum noted. Previous left thoracotomy with screw and plate fixation of the right fifth rib posteriorly. CT ABDOMEN PELVIS FINDINGS Hepatobiliary: No focal liver abnormalities. The gallbladder appears normal. No biliary dilatation. Pancreas: The pancreas appears atrophic and partially fatty replaced with scattered calcifications suggestive of chronic pancreatitis. Indeterminate nodule along the anterior surface of the head of pancreas measures 2.0 cm, image 72/3. Unchanged from previous exam. Spleen: Normal in size without focal abnormality. Adrenals/Urinary Tract: Normal adrenal glands. No kidney mass or hydronephrosis identified. 9 mm area of hyper attenuation within the inferior pole of left kidney is identified, incompletely characterized without IV contrast. The urinary bladder contains gas and is partially collapsed around a Foley catheter balloon. Stomach/Bowel: Stomach is nondistended. The small bowel loops have a normal caliber. Unremarkable appearance of the proximal colon. Distal colonic diverticula noted without acute inflammation. Vascular/Lymphatic: Aortic atherosclerosis including branch vessel disease. No aneurysm. No adenopathy identified within the abdomen. No pelvic or inguinal adenopathy. Reproductive: Prostate is unremarkable. Other: No free fluid or fluid collections. Musculoskeletal: Degenerative disc disease identified  within the lumbar spine. No suspicious osseous lesions identified. IMPRESSION: 1. Bilateral pleural effusions with overlying areas of atelectasis and airspace consolidation noted. The airspace consolidation may reflect pneumonia and/or aspiration. Atelectasis is likely passive. 2. Postoperative change and volume loss from the right middle lobe and right upper lobe. A geographic area of masslike architectural distortion within the right upper lobe suggest sequelae of prior external beam radiation. 3. Scattered nonspecific nodules are identified within both lungs. Many of these can have are either ground-glass or tree-in-bud in configuration favoring with an inflammatory or infectious bronchiolitis. Others are either solid or have a cavitary appearance and are less specific. Well these may also be inflammatory or infectious in etiology metastatic disease in this patient who has a history of Lung cancer cannot be excluded. Comparison with any available outside studies may be helpful. 4. Sequelae of chronic pancreatitis. Along the anterior aspect of the head of pancreas there is a rounded nodule which is nonspecific. This may represent a peripancreatic lymph node or lesion arising from within the head of pancreas. More definitive assessment with nonemergent contrast enhanced MRI would be helpful for further characterization. 5. Extensive aortic atherosclerosis and 3 vessel coronary artery atherosclerotic calcifications. Aortic Atherosclerosis (ICD10-I70.0). 6.  Diffuse bronchial wall thickening with emphysema, as above; imaging findings suggestive of underlying COPD. Emphysema (ICD10-J43.9). 7. Electronically Signed   By: Kerby Moors M.D.   On: 09/24/2017 14:28   Ct Abdomen Wo Contrast  Result Date: 09/21/2017 CLINICAL DATA:  Assessment for possible percutaneous gastrostomy tube placement. EXAM: CT ABDOMEN WITHOUT CONTRAST TECHNIQUE: Multidetector CT imaging of the abdomen was performed following the standard  protocol without IV contrast. COMPARISON:  None. FINDINGS: Lower chest: Bilateral lower lobe atelectasis present. Hepatobiliary: Unenhanced appearance of the liver is unremarkable. The gallbladder appears mildly distended without surrounding inflammation. Pancreas: Atrophic pancreas with scattered calcifications suggestive of prior pancreatitis. Spleen: Normal in size without focal abnormality. Adrenals/Urinary Tract: Adrenal glands are unremarkable. Kidneys are normal, without renal calculi, focal lesion, or hydronephrosis. Stomach/Bowel: No hiatal hernia. Nasogastric tube extends into the mid body of the stomach. The stomach is decompressed. There is moderate dilatation of the colon with redundant transverse colon and splenic flexure draped around the proximal and distal aspects of the stomach. The colon itself at this level contains a moderate amount of fecal material. Anatomy is not ideal for gastrostomy tube placement with likely increased risk of colonic injury. No evidence of small bowel dilatation, bowel inflammation, mass or free intraperitoneal air. There is a roughly 1.5 x 2.0 cm rounded structure immediately abutting the posterior wall of the gastric antrum and demonstrating focal posterior calcification. This also lies just superior to the anterior aspect of the pancreatic head. This measures cystic/complex cystic density and may represent a GI duplication cyst, cyst related to prior pancreatitis or other cyst. Vascular/Lymphatic: Calcified plaque of the abdominal aorta and proximal common iliac arteries without evidence of aneurysm. Other: No abdominal wall hernia. No ascites or focal fluid collections. Musculoskeletal: The spine demonstrates diffuse degenerative disc disease. IMPRESSION: 1. Moderately distended and redundant colon containing moderate amount of fecal material and draped around much of the stomach. This places the patient at higher risk for colonic injury with percutaneous gastrostomy  placement. A bowel prep may be helpful to decompress the colon and may make gastrostomy tube placement more feasible and reduce risk. 2. 2 cm complex cystic structure abutting the posterior wall of the gastric antrum and just superior to the pancreatic head. Differential includes a GI duplication cyst, pseudocyst related to prior pancreatitis or other cyst. Elective evaluation with MRI may be helpful if/when the patient is able. It is felt that this is most likely a benign abnormality. 3. Evidence of prior pancreatitis with atrophic pancreas demonstrating scattered calcifications. Electronically Signed   By: Aletta Edouard M.D.   On: 09/21/2017 12:11   Dg Abd 1 View  Result Date: 09/19/2017 CLINICAL DATA:  Increased vomiting.  Concern for bowel obstruction. EXAM: ABDOMEN - 1 VIEW COMPARISON:  Abdominal x-ray dated September 12, 2017. FINDINGS: Unchanged positioning of the enteric tube with the side port in the distal esophagus near the GE junction. Nonobstructive bowel gas pattern. Possible small bilateral renal calculi. No acute osseous abnormality. IMPRESSION: 1. Unchanged positioning of the enteric tube with the side port in the distal esophagus. Recommend advancement. 2. No evidence of bowel obstruction. 3. Possible small bilateral nephrolithiasis. Electronically Signed   By: Titus Dubin M.D.   On: 09/19/2017 09:49   Dg Abd 1 View  Result Date: 09/07/2017 CLINICAL DATA:  Orogastric tube placement EXAM: ABDOMEN - 1 VIEW COMPARISON:  08/31/2017 FINDINGS: The tip of the orogastric tube is in the body of the stomach. Gas-filled loops of small and large bowel  are scattered across the abdomen and a mild ileus pattern. There is no obvious free intraperitoneal gas. IMPRESSION: NG tube tip is in the body of the stomach. Mild ileus pattern. Electronically Signed   By: Marybelle Killings M.D.   On: 09/07/2017 19:35   Ct Chest Wo Contrast  Result Date: 09/24/2017 CLINICAL DATA:  Evaluate for aspiration. Followup for  consideration of peg placement. History of lung cancer. EXAM: CT CHEST, ABDOMEN AND PELVIS WITHOUT CONTRAST TECHNIQUE: Multidetector CT imaging of the chest, abdomen and pelvis was performed following the standard protocol without IV contrast. COMPARISON:  None FINDINGS: CT CHEST FINDINGS Cardiovascular: The heart size appears normal. Aortic atherosclerosis. Calcifications in the LAD, left circumflex and RCA coronary artery noted. 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 A tracheostomy tube tip is above the carina. Mildly dilated and fluid-filled esophagus containing a nasogastric tube is identified. The tip of the tube is in the body of the stomach. No enlarged mediastinal or hilar lymph nodes. Lungs/Pleura: There are small bilateral pleural effusions identified left greater than right. Mild lower lobe predominant interlobular septal thickening is noted compatible with pulmonary edema. Postoperative changes and volume loss within the right lung identified suggesting prior partial lobectomies from the right upper lobe and right middle lobe. Moderate changes of centrilobular emphysema noted with diffuse bronchial wall thickening noted. Airspace consolidation and passive atelectasis is identified within both lower lobes. Within the right upper lobe there is a area of masslike architectural distortion which has a geographic distribution, image 56/4. The appearance is suggestive of changes due to external beam radiation. Masslike architectural distortion within the left lung apex is noted which may be due to chronic scarring and/or infection. Multiple tiny nodules are identified throughout the right lung and left lower lobe. Some of these have a tree-in-bud configuration favoring an inflammatory or infectious process. Others are less specific including a 1.1 cm cavitary nodule within the lateral right lower lobe, image 89/4. Non cavitary solid  nodule in the right base measures 8 mm, image 105/4. In the left lower lobe there is a solid non cavitary nodule measuring 7 mm, image 90/4. Scattered small ground-glass attenuating nodules noted predominantly involving the right lung. Musculoskeletal: Multi level spondylosis identified throughout the thoracic spine. Chronic fracture deformity involving the proximal body of sternum noted. Previous left thoracotomy with screw and plate fixation of the right fifth rib posteriorly. CT ABDOMEN PELVIS FINDINGS Hepatobiliary: No focal liver abnormalities. The gallbladder appears normal. No biliary dilatation. Pancreas: The pancreas appears atrophic and partially fatty replaced with scattered calcifications suggestive of chronic pancreatitis. Indeterminate nodule along the anterior surface of the head of pancreas measures 2.0 cm, image 72/3. Unchanged from previous exam. Spleen: Normal in size without focal abnormality. Adrenals/Urinary Tract: Normal adrenal glands. No kidney mass or hydronephrosis identified. 9 mm area of hyper attenuation within the inferior pole of left kidney is identified, incompletely characterized without IV contrast. The urinary bladder contains gas and is partially collapsed around a Foley catheter balloon. Stomach/Bowel: Stomach is nondistended. The small bowel loops have a normal caliber. Unremarkable appearance of the proximal colon. Distal colonic diverticula noted without acute inflammation. Vascular/Lymphatic: Aortic atherosclerosis including branch vessel disease. No aneurysm. No adenopathy identified within the abdomen. No pelvic or inguinal adenopathy. Reproductive: Prostate is unremarkable. Other: No free fluid or fluid collections. Musculoskeletal: Degenerative disc disease identified within the lumbar spine. No suspicious osseous lesions identified. IMPRESSION: 1. Bilateral pleural effusions with overlying areas  of atelectasis and airspace consolidation noted. The airspace  consolidation may reflect pneumonia and/or aspiration. Atelectasis is likely passive. 2. Postoperative change and volume loss from the right middle lobe and right upper lobe. A geographic area of masslike architectural distortion within the right upper lobe suggest sequelae of prior external beam radiation. 3. Scattered nonspecific nodules are identified within both lungs. Many of these can have are either ground-glass or tree-in-bud in configuration favoring with an inflammatory or infectious bronchiolitis. Others are either solid or have a cavitary appearance and are less specific. Well these may also be inflammatory or infectious in etiology metastatic disease in this patient who has a history of Lung cancer cannot be excluded. Comparison with any available outside studies may be helpful. 4. Sequelae of chronic pancreatitis. Along the anterior aspect of the head of pancreas there is a rounded nodule which is nonspecific. This may represent a peripancreatic lymph node or lesion arising from within the head of pancreas. More definitive assessment with nonemergent contrast enhanced MRI would be helpful for further characterization. 5. Extensive aortic atherosclerosis and 3 vessel coronary artery atherosclerotic calcifications. Aortic Atherosclerosis (ICD10-I70.0). 6. Diffuse bronchial wall thickening with emphysema, as above; imaging findings suggestive of underlying COPD. Emphysema (ICD10-J43.9). 7. Electronically Signed   By: Kerby Moors M.D.   On: 09/24/2017 14:28   Dg Chest Port 1 View  Result Date: 09/19/2017 CLINICAL DATA:  Acute on chronic respiratory failure. EXAM: PORTABLE CHEST 1 VIEW COMPARISON:  Single-view of the chest 09/15/2017 and 09/06/2017. FINDINGS: Tracheostomy tube and NG tube remain in place. Left basilar and right upper lobe airspace opacities show some improvement since the most recent examination. No pneumothorax. No pleural effusion. Heart size is upper normal. Plate and screws along  the posterior arc of the right fifth rib are unchanged. IMPRESSION: Some improvement in right upper lobe and left basilar airspace disease since the most recent examination. Electronically Signed   By: Inge Rise M.D.   On: 09/19/2017 15:07   Dg Chest Port 1 View  Result Date: 09/15/2017 CLINICAL DATA:  Respiratory failure. EXAM: PORTABLE CHEST 1 VIEW COMPARISON:  Radiograph 09/06/2017 FINDINGS: Tracheostomy tube at the thoracic inlet. Enteric tube is in place, tip not included in the field of view below the diaphragm. Prior right central line is been removed. There is a new dense right upper lobe opacity. Surgical staples at the right hilum. Unchanged heart size and mediastinal contours. Unchanged left lung base opacity. Left pleural effusion appears similar, similar blunting of right costophrenic angle. No pneumothorax. Prominent pulmonary vasculature is unchanged. Surgical fixation of right posterior rib again seen. IMPRESSION: 1. Dense right upper lobe opacity is new from prior exam, may represent atelectasis/collapse, pneumonia or aspiration. 2. Unchanged left lung base opacity likely combination of atelectasis and pleural fluid. 3. Tracheostomy tube at the thoracic inlet. Electronically Signed   By: Jeb Levering M.D.   On: 09/15/2017 01:47   Dg Chest Port 1 View  Result Date: 09/06/2017 CLINICAL DATA:  Respiratory failure, intubated patient, sepsis, COPD, coronary artery disease. EXAM: PORTABLE CHEST 1 VIEW COMPARISON:  Portable chest x-ray of Aug 31, 2017 FINDINGS: The lungs are reasonably well inflated. Bibasilar densities persist greatest on the left. There is no pneumothorax. The cardiac silhouette is enlarged. The pulmonary vascularity is not clearly engorged. The interstitial markings of both lungs have improved. There is an endotracheal tube present whose tip projects approximately 6 cm above the carina. The esophagogastric tube tip in proximal port project below the GE  junction. The  right internal jugular venous catheter tip projects at the junction of the right and left brachiocephalic veins. IMPRESSION: Mild improvement in the appearance of the pulmonary interstitium bilaterally may reflect resolving pulmonary edema. Decreased pleural effusions. Persistent left basilar atelectasis or pneumonia and likely minimal right basilar atelectasis. No large pleural effusion. The support tubes are in reasonable position. Electronically Signed   By: David  Martinique M.D.   On: 09/06/2017 07:44   Dg Chest Port 1 View  Result Date: 08/31/2017 CLINICAL DATA:  Respiratory failure EXAM: PORTABLE CHEST 1 VIEW COMPARISON:  None. FINDINGS: Cardiomegaly. There are bilateral pleural effusions, left greater than right. Vascular congestion. Bibasilar atelectasis or infiltrates, left greater than right. Endotracheal tube is approximately 6 cm above the carina. NG tube enters the stomach. Right central line tip is in the right innominate vein. IMPRESSION: Cardiomegaly. Bilateral pleural effusions, left greater than right. Bibasilar atelectasis or infiltrates, left greater than right. Electronically Signed   By: Rolm Baptise M.D.   On: 08/31/2017 18:52   Dg Abd Portable 1v  Result Date: 09/16/2017 CLINICAL DATA:  Status post nasogastric tube placement. EXAM: PORTABLE ABDOMEN - 1 VIEW COMPARISON:  KUB of Sep 07, 2017 FINDINGS: The nasogastric tube's proximal port lies at the GE junction with the tip in the cardia. Advancement is needed. IMPRESSION: Advancement of the nasogastric tube by approximately 10 cm would assure that the proximal port is positioned well below the GE junction. Electronically Signed   By: David  Martinique M.D.   On: 09/26/2017 14:25   Dg Abd Portable 1v  Result Date: 08/31/2017 CLINICAL DATA:  NG tube placement EXAM: PORTABLE ABDOMEN - 1 VIEW COMPARISON:  None. FINDINGS: NG tube tip is in the distal stomach region. Nonobstructive bowel gas pattern. IMPRESSION: NG tube tip in the distal  stomach. Electronically Signed   By: Rolm Baptise M.D.   On: 08/31/2017 18:52    Labs:  CBC: Recent Labs    09/20/17 0759 09/23/17 0708 09/24/17 0646 09/27/17 0547  WBC 15.1* 14.4* 17.2* 15.3*  HGB 9.4* 10.0* 7.7* 8.1*  HCT 32.1* 33.3* 25.2* 25.5*  PLT 240 267 284 267    COAGS: Recent Labs    09/01/17 0536  INR 1.27    BMP: Recent Labs    09/23/17 0708 09/24/17 0646 09/27/17 0547 09/29/17 0649  NA 145 141 134* 134*  K 3.2* 2.7* 2.9* 3.1*  CL 104 101 96* 100*  CO2 33* 34* 29 29  GLUCOSE 120* 156* 114* 139*  BUN 21* 16 10 12   CALCIUM 8.5* 8.1* 8.2* 8.2*  CREATININE 0.43* 0.42* 0.52* 0.37*  GFRNONAA >60 >60 >60 >60  GFRAA >60 >60 >60 >60    LIVER FUNCTION TESTS: Recent Labs    09/01/17 0536  BILITOT 1.2  AST 110*  ALT 45  ALKPHOS 97  PROT 5.5*  ALBUMIN 1.3*    TUMOR MARKERS: No results for input(s): AFPTM, CEA, CA199, CHROMGRNA in the last 8760 hours.  Assessment and Plan: Dysphagia, long-term care Patient with respiratory failure requiring trach/vent.  Now with ongoing respiratory infection, poor wound healing, and overall debilitation.  Not weaning well from vent.  Request made for gastrostomy tube placement by Dr. Laren Everts.  Patient has NGT for now.  His Tmax is 101.8 and his WBC is elevated at 15K presumably from respiratory source.  Will monitor medical status.  CT Abdomen has been completed and patient approved for gastrostomy attempt with barium.  Will check status over the coming days and  work towards placement possibly once medically stable.  Will also need to work toward obtaining consent with family.   Thank you for this interesting consult.  I greatly enjoyed meeting [employer] and look forward to participating in their care.  A copy of this report was sent to the requesting provider on this date.  Electronically Signed: Docia Barrier, PA 09/29/2017, 4:26 PM   I spent a total of 40 Minutes    in face to face in  clinical consultation, greater than 50% of which was counseling/coordinating care for acute respiratory failure.

## 2017-09-30 LAB — CULTURE, RESPIRATORY

## 2017-09-30 LAB — CULTURE, RESPIRATORY W GRAM STAIN

## 2017-09-30 LAB — VANCOMYCIN, TROUGH: VANCOMYCIN TR: 26 ug/mL — AB (ref 15–20)

## 2017-10-01 LAB — BASIC METABOLIC PANEL
ANION GAP: 6 (ref 5–15)
BUN: 11 mg/dL (ref 6–20)
CALCIUM: 8.4 mg/dL — AB (ref 8.9–10.3)
CO2: 29 mmol/L (ref 22–32)
Chloride: 101 mmol/L (ref 101–111)
Creatinine, Ser: 0.45 mg/dL — ABNORMAL LOW (ref 0.61–1.24)
GFR calc Af Amer: >60 mL/min (ref >60)
GFR calc non Af Amer: >60 mL/min (ref >60)
GLUCOSE: 154 mg/dL — AB (ref 65–99)
POTASSIUM: 3.2 mmol/L — AB (ref 3.5–5.1)
Sodium: 136 mmol/L (ref 135–145)

## 2017-10-01 LAB — VANCOMYCIN, TROUGH: Vancomycin Tr: 10 ug/mL — ABNORMAL LOW (ref 15–20)

## 2017-10-02 LAB — GENTAMICIN LEVEL, RANDOM: Gentamicin Rm: 11 ug/mL

## 2017-10-02 LAB — CBC
HCT: 23.7 % — ABNORMAL LOW (ref 39.0–52.0)
Hemoglobin: 7.3 g/dL — ABNORMAL LOW (ref 13.0–17.0)
MCH: 27.7 pg (ref 26.0–34.0)
MCHC: 30.8 g/dL (ref 30.0–36.0)
MCV: 89.8 fL (ref 78.0–100.0)
PLATELETS: 243 10*3/uL (ref 150–400)
RBC: 2.64 MIL/uL — AB (ref 4.22–5.81)
RDW: 19.9 % — AB (ref 11.5–15.5)
WBC: 16.1 10*3/uL — AB (ref 4.0–10.5)

## 2017-10-03 ENCOUNTER — Other Ambulatory Visit (HOSPITAL_COMMUNITY): Payer: Self-pay

## 2017-10-03 DIAGNOSIS — J9621 Acute and chronic respiratory failure with hypoxia: Secondary | ICD-10-CM | POA: Diagnosis not present

## 2017-10-03 DIAGNOSIS — J449 Chronic obstructive pulmonary disease, unspecified: Secondary | ICD-10-CM | POA: Diagnosis not present

## 2017-10-03 DIAGNOSIS — I482 Chronic atrial fibrillation: Secondary | ICD-10-CM | POA: Diagnosis not present

## 2017-10-03 DIAGNOSIS — A4102 Sepsis due to Methicillin resistant Staphylococcus aureus: Secondary | ICD-10-CM | POA: Diagnosis not present

## 2017-10-03 DIAGNOSIS — J189 Pneumonia, unspecified organism: Secondary | ICD-10-CM | POA: Diagnosis not present

## 2017-10-03 LAB — PROTIME-INR
INR: 2.67
PROTHROMBIN TIME: 28.2 s — AB (ref 11.4–15.2)

## 2017-10-03 LAB — GENTAMICIN LEVEL, RANDOM: Gentamicin Rm: 10.7 ug/mL

## 2017-10-03 LAB — BASIC METABOLIC PANEL
Anion gap: 6 (ref 5–15)
BUN: 7 mg/dL (ref 6–20)
CHLORIDE: 100 mmol/L — AB (ref 101–111)
CO2: 31 mmol/L (ref 22–32)
Calcium: 8.3 mg/dL — ABNORMAL LOW (ref 8.9–10.3)
Creatinine, Ser: 0.36 mg/dL — ABNORMAL LOW (ref 0.61–1.24)
GFR calc Af Amer: >60 mL/min (ref >60)
GFR calc non Af Amer: >60 mL/min (ref >60)
Glucose, Bld: 90 mg/dL (ref 65–99)
POTASSIUM: 2.8 mmol/L — AB (ref 3.5–5.1)
Sodium: 137 mmol/L (ref 135–145)

## 2017-10-03 LAB — VANCOMYCIN, TROUGH: Vancomycin Tr: 16 ug/mL (ref 15–20)

## 2017-10-03 NOTE — Progress Notes (Signed)
Attempted to call wife for consent for gastrostomy.  No answer at number in chart.  Will continue to work towards consent and procedure.   Brynda Greathouse, MS RD PA-C 4:30 PM

## 2017-10-03 NOTE — Progress Notes (Signed)
Pulmonary [place]   PULMONARY SERVICE  PROGRESS NOTE  Date of Service: 10/03/2017  Dwayne Griffin  ONG:[id number]  DOB: 1940-06-08   DOA: 10/03/2017  Referring Physician: Merton Border, MD  HPI: Dwayne Griffin is a 77 y.o. male seen for follow up of Acute on Chronic Respiratory Failure.  Patient right now is on full vent support.  Has been requiring 70% oxygen.  Patient has not had a recent chest x-ray done which was ordered today during rounds.  The patient was on a PEEP of 5 I discussed the possibility of increasing his FiO2 and PEEP also.  Medications: Reviewed on Rounds  Physical Exam:  Vitals: Temperature 97.7 pulse 107 respiratory rate 19 blood pressure 117/61 saturations 95%  Ventilator Settings mode of ventilation assist control FiO2 70% tidal volume 500 PEEP 5  . General: Comfortable at this time . Eyes: Grossly normal lids, irises & conjunctiva . ENT: grossly tongue is normal . Neck: no obvious mass . Cardiovascular: S1 S2 normal no gallop . Respiratory: Coarse rhonchi are noted bilaterally . Abdomen: soft . Skin: no rash seen on limited exam . Musculoskeletal: not rigid . Psychiatric:unable to assess . Neurologic: no seizure no involuntary movements         Lab Data:   Basic Metabolic Panel: Recent Labs  Lab 09/27/17 0547 09/29/17 0649 10/01/17 0758 10/02/17 2325  NA 134* 134* 136 137  K 2.9* 3.1* 3.2* 2.8*  CL 96* 100* 101 100*  CO2 29 29 29 31   GLUCOSE 114* 139* 154* 90  BUN 10 12 11 7   CREATININE 0.52* 0.37* 0.45* 0.36*  CALCIUM 8.2* 8.2* 8.4* 8.3*  MG 1.5* 1.7  --   --     Liver Function Tests: No results for input(s): AST, ALT, ALKPHOS, BILITOT, PROT, ALBUMIN in the last 168 hours. No results for input(s): LIPASE, AMYLASE in the last 168 hours. No results for input(s): AMMONIA in the last 168 hours.  CBC: Recent Labs  Lab 09/27/17 0547 10/02/17 2325  WBC 15.3* 16.1*  HGB 8.1* 7.3*  HCT  25.5* 23.7*  MCV 87.3 89.8  PLT 267 243    Cardiac Enzymes: No results for input(s): CKTOTAL, CKMB, CKMBINDEX, TROPONINI in the last 168 hours.  BNP (last 3 results) No results for input(s): BNP in the last 8760 hours.  ProBNP (last 3 results) No results for input(s): PROBNP in the last 8760 hours.  Radiological Exams: No results found.  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 Acute on chronic respiratory failure with hypoxia patient's oxygen requirements have gone up as discussed we will try to increase the PEEP get a follow-up chest x-ray.  Patient does have a history of MRSA and of course would be of concern if there is recurrence or worsening of his chest film. 2. Chronic atrial fibrillation rate is controlled 3. COPD severe disease 4. Sepsis due to MRSA we will continue with present management hemodynamically appears to be stable 5. Healthcare associated pneumonia follow-up chest film was ordered as already discussed.   I have personally seen and evaluated the patient, evaluated laboratory and imaging results, formulated the assessment and plan and placed orders. The Patient requires high complexity decision making for assessment and support.  Case  was discussed on Rounds with the Respiratory Therapy Staff time 35 minutes review of the medical chart and discussion on rounds with medical staff  Allyne Gee, MD [facility] Pulmonary Critical Care Medicine Sleep Medicine

## 2017-10-04 ENCOUNTER — Other Ambulatory Visit (HOSPITAL_COMMUNITY): Payer: Self-pay

## 2017-10-04 DIAGNOSIS — A4102 Sepsis due to Methicillin resistant Staphylococcus aureus: Secondary | ICD-10-CM | POA: Diagnosis not present

## 2017-10-04 DIAGNOSIS — I482 Chronic atrial fibrillation: Secondary | ICD-10-CM | POA: Diagnosis not present

## 2017-10-04 DIAGNOSIS — J449 Chronic obstructive pulmonary disease, unspecified: Secondary | ICD-10-CM | POA: Diagnosis not present

## 2017-10-04 DIAGNOSIS — J189 Pneumonia, unspecified organism: Secondary | ICD-10-CM | POA: Diagnosis not present

## 2017-10-04 DIAGNOSIS — J9621 Acute and chronic respiratory failure with hypoxia: Secondary | ICD-10-CM | POA: Diagnosis not present

## 2017-10-04 LAB — POTASSIUM
Potassium: 3.4 mmol/L — ABNORMAL LOW (ref 3.5–5.1)
Potassium: 5.2 mmol/L — ABNORMAL HIGH (ref 3.5–5.1)

## 2017-10-04 NOTE — Progress Notes (Addendum)
Pulmonary [name]   PULMONARY SERVICE  PROGRESS NOTE  Date of Service: 10/04/2017  Dwayne TOUT  HEN:[id number]  DOB: [birth year]   DOA: 10/02/2017  Referring Physician: Merton Border, MD  HPI: Dwayne Griffin is a 77 y.o. male seen for follow up of Acute on Chronic Respiratory Failure.  He remains on full vent support.  I looked at the chest x-ray chest x-ray shows a dense consolidation on the left side with progression of pneumonia.  The right side actually did not look too bad.  Patient right now is on 80% oxygen full vent support with a PEEP of 5 I think he may benefit from increasing the PEEP in addition the patient has been on Zosyn as well as gentamicin because of Pseudomonas which was growing on the sputum.  Medications: Reviewed on Rounds  Physical Exam:  Vitals: Temperature 97.5 pulse 95 respiratory rate 21 blood pressure 95/57 saturations 95%  Ventilator Settings mode of ventilation assist control FiO2 80% PEEP of 5  . General: Comfortable at this time . Eyes: Grossly normal lids, irises & conjunctiva . ENT: grossly tongue is normal . Neck: no obvious mass . Cardiovascular: S1 S2 normal no gallop . Respiratory: Coarse breath sounds are noted scattered rhonchi . Abdomen: soft . Skin: no rash seen on limited exam . Musculoskeletal: not rigid . Psychiatric:unable to assess . Neurologic: no seizure no involuntary movements         Lab Data:   Basic Metabolic Panel: Recent Labs  Lab 09/29/17 0649 10/01/17 0758 10/02/17 2325 10/04/17 0731  NA 134* 136 137  --   K 3.1* 3.2* 2.8* 3.4*  CL 100* 101 100*  --   CO2 29 29 31   --   GLUCOSE 139* 154* 90  --   BUN 12 11 7   --   CREATININE 0.37* 0.45* 0.36*  --   CALCIUM 8.2* 8.4* 8.3*  --   MG 1.7  --   --   --     Liver Function Tests: No results for input(s): AST, ALT, ALKPHOS, BILITOT, PROT, ALBUMIN in the last 168 hours. No results for input(s): LIPASE, AMYLASE in  the last 168 hours. No results for input(s): AMMONIA in the last 168 hours.  CBC: Recent Labs  Lab 10/02/17 2325  WBC 16.1*  HGB 7.3*  HCT 23.7*  MCV 89.8  PLT 243    Cardiac Enzymes: No results for input(s): CKTOTAL, CKMB, CKMBINDEX, TROPONINI in the last 168 hours.  BNP (last 3 results) No results for input(s): BNP in the last 8760 hours.  ProBNP (last 3 results) No results for input(s): PROBNP in the last 8760 hours.  Radiological Exams: Dg Chest Port 1 View  Result Date: 10/03/2017 CLINICAL DATA:  Pneumonia. EXAM: PORTABLE CHEST 1 VIEW COMPARISON:  Chest CT 09/24/2017 and radiograph 09/19/2017 FINDINGS: The patient is mildly rotated to the right. A tracheostomy tube overlies the airway. Enteric tube courses into the upper abdomen with tip not imaged. 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 Right upper lung airspace opacities on the prior studies may have improved. However, there is now extensive airspace opacity throughout the entirety of the left lung. There are likely persistent small pleural effusions. No pneumothorax is identified. IMPRESSION: Extensive airspace disease throughout the left lung, markedly worsened from the prior studies. Electronically Signed   By: Seymour Bars.D.  On: 10/03/2017 16:47   Dg Abd Portable 1v  Result Date: 10/04/2017 CLINICAL DATA:  Abdominal pain EXAM: PORTABLE ABDOMEN - 1 VIEW COMPARISON:  09/24/2017 FINDINGS: Scattered large and small bowel gas is noted. Nasogastric catheter is seen with the tip extending into the stomach. Proximal side port remains in the distal esophagus. This should be advanced several cm. Degenerative changes of lumbar spine are noted. No acute abnormality is noted. IMPRESSION: Nasogastric catheter as described. This should be advanced several cm. No other focal abnormality is noted. Electronically Signed   By: Inez Catalina M.D.   On: 10/04/2017 09:03    Assessment/Plan Principal Problem:   Acute on chronic respiratory failure with hypoxemia (HCC) Active Problems:   Chronic atrial fibrillation (HCC)   COPD (chronic obstructive pulmonary disease) (HCC)   Sepsis due to methicillin resistant Staphylococcus aureus (MRSA) (HCC)   Healthcare-associated pneumonia   1. Acute on chronic respiratory failure with hypoxia patient continues on full vent support high oxygen requirements I think we can improve this by increasing his PEEP will go up on the PEEP to 8.  Also will adjust tidal volumes accordingly. 2. Both care associated pneumonia has worsening of infiltrates on his chest film.  Has been on antibiotics I would consider doing a bronchoscopy for better culture specimen will be able to hopefully do a quick in and out washing if his saturations maintain as they are right now.  I spoke with the patient's wife explained to her that the procedure is high risk at this point she states that she wants everything done for her husband and is willing to take the risk. 3. Chronic atrial fibrillation rate is controlled we will continue with supportive care 4. COPD severe disease poor prognosis 5. Sepsis blood pressure is somewhat tenuous we will continue with present management.   I have personally seen and evaluated the patient, evaluated laboratory and imaging results, formulated the assessment and plan and placed orders. The Patient requires high complexity decision making for assessment and support.  Case was discussed on Rounds with the Respiratory Therapy Staff time 35 minutes discussion with the patient's family as well as medical staff and respiratory care team  Allyne Gee, MD [facility] Pulmonary Critical Care Medicine Sleep Medicine

## 2017-10-05 DIAGNOSIS — J9621 Acute and chronic respiratory failure with hypoxia: Secondary | ICD-10-CM | POA: Diagnosis not present

## 2017-10-05 DIAGNOSIS — I482 Chronic atrial fibrillation: Secondary | ICD-10-CM | POA: Diagnosis not present

## 2017-10-05 DIAGNOSIS — J189 Pneumonia, unspecified organism: Secondary | ICD-10-CM | POA: Diagnosis not present

## 2017-10-05 DIAGNOSIS — J449 Chronic obstructive pulmonary disease, unspecified: Secondary | ICD-10-CM | POA: Diagnosis not present

## 2017-10-05 LAB — PREPARE RBC (CROSSMATCH)

## 2017-10-05 LAB — BASIC METABOLIC PANEL
Anion gap: 8 (ref 5–15)
BUN: 8 mg/dL (ref 8–23)
CALCIUM: 8.9 mg/dL (ref 8.9–10.3)
CO2: 32 mmol/L (ref 22–32)
CREATININE: 0.42 mg/dL — AB (ref 0.61–1.24)
Chloride: 101 mmol/L (ref 98–111)
GFR calc Af Amer: >60 mL/min (ref >60)
GFR calc non Af Amer: >60 mL/min (ref >60)
GLUCOSE: 144 mg/dL — AB (ref 70–99)
POTASSIUM: 3.4 mmol/L — AB (ref 3.5–5.1)
SODIUM: 141 mmol/L (ref 135–145)

## 2017-10-05 LAB — CBC
HCT: 23.3 % — ABNORMAL LOW (ref 39.0–52.0)
Hemoglobin: 6.9 g/dL — CL (ref 13.0–17.0)
MCH: 27.1 pg (ref 26.0–34.0)
MCHC: 29.6 g/dL — AB (ref 30.0–36.0)
MCV: 91.4 fL (ref 78.0–100.0)
PLATELETS: 182 10*3/uL (ref 150–400)
RBC: 2.55 MIL/uL — AB (ref 4.22–5.81)
RDW: 20.6 % — AB (ref 11.5–15.5)
WBC: 8.6 10*3/uL (ref 4.0–10.5)

## 2017-10-05 LAB — ABO/RH: ABO/RH(D): O POS

## 2017-10-05 NOTE — Progress Notes (Addendum)
Pulmonary [place]   PULMONARY SERVICE  PROGRESS NOTE  Date of Service: 10/05/2017  CREEDENCE KUNESH  SNK:[id number]  DOB: 06-12-1940   DOA: 09/19/2017  Referring Physician: Merton Border, MD  HPI: Lasalle Abee Porzio is a 77 y.o. male seen for follow up of Acute on Chronic Respiratory Failure.  Patient is doing well today still requiring high oxygen levels.  Hemoglobin was also reduced so is going to be requiring blood.  Patient's on a PEEP of 8 right now  Medications: Reviewed on Rounds  Physical Exam:  Vitals: Temperature 97.2 pulse is 86 respiratory rate 27 blood pressure 136/62 saturations 96%  Ventilator Settings mode of ventilation assist control FiO2 70% tidal volume 530 PEEP of 8  . General: Comfortable at this time . Eyes: Grossly normal lids, irises & conjunctiva . ENT: grossly tongue is normal . Neck: no obvious mass . Cardiovascular: S1 S2 normal no gallop . Respiratory: Coarse breath sounds noted bilaterally . Abdomen: soft . Skin: no rash seen on limited exam . Musculoskeletal: not rigid . Psychiatric:unable to assess . Neurologic: no seizure no involuntary movements         Lab Data:   Basic Metabolic Panel: Recent Labs  Lab 09/29/17 0649 10/01/17 0758 10/02/17 2325 10/04/17 0731 10/04/17 2058 10/05/17 0702  NA 134* 136 137  --   --  141  K 3.1* 3.2* 2.8* 3.4* 5.2* 3.4*  CL 100* 101 100*  --   --  101  CO2 29 29 31   --   --  32  GLUCOSE 139* 154* 90  --   --  144*  BUN 12 11 7   --   --  8  CREATININE 0.37* 0.45* 0.36*  --   --  0.42*  CALCIUM 8.2* 8.4* 8.3*  --   --  8.9  MG 1.7  --   --   --   --   --     Liver Function Tests: No results for input(s): AST, ALT, ALKPHOS, BILITOT, PROT, ALBUMIN in the last 168 hours. No results for input(s): LIPASE, AMYLASE in the last 168 hours. No results for input(s): AMMONIA in the last 168 hours.  CBC: Recent Labs  Lab 10/02/17 2325 10/05/17 0702  WBC 16.1* 8.6   HGB 7.3* 6.9*  HCT 23.7* 23.3*  MCV 89.8 91.4  PLT 243 182    Cardiac Enzymes: No results for input(s): CKTOTAL, CKMB, CKMBINDEX, TROPONINI in the last 168 hours.  BNP (last 3 results) No results for input(s): BNP in the last 8760 hours.  ProBNP (last 3 results) No results for input(s): PROBNP in the last 8760 hours.  Radiological Exams: Dg Chest Port 1 View  Result Date: 10/03/2017 CLINICAL DATA:  Pneumonia. EXAM: PORTABLE CHEST 1 VIEW COMPARISON:  Chest CT 09/24/2017 and radiograph 09/19/2017 FINDINGS: The patient is mildly rotated to the right. A tracheostomy tube overlies the airway. Enteric tube courses into the upper abdomen with tip not imaged. 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 Right upper lung airspace opacities on the prior studies may have improved. However, there is now extensive airspace opacity throughout the entirety of the left lung. There are likely persistent small pleural effusions. No pneumothorax is identified. IMPRESSION: Extensive airspace disease throughout the left lung, markedly worsened from the prior studies. Electronically Signed   By: Logan Bores M.D.   On:  10/03/2017 16:47   Dg Abd Portable 1v  Result Date: 10/04/2017 CLINICAL DATA:  Abdominal pain EXAM: PORTABLE ABDOMEN - 1 VIEW COMPARISON:  09/24/2017 FINDINGS: Scattered large and small bowel gas is noted. Nasogastric catheter is seen with the tip extending into the stomach. Proximal side port remains in the distal esophagus. This should be advanced several cm. Degenerative changes of lumbar spine are noted. No acute abnormality is noted. IMPRESSION: Nasogastric catheter as described. This should be advanced several cm. No other focal abnormality is noted. Electronically Signed   By: Inez Catalina M.D.   On: 10/04/2017 09:03    Assessment/Plan Principal Problem:   Acute on chronic respiratory  failure with hypoxemia (HCC) Active Problems:   Chronic atrial fibrillation (HCC)   COPD (chronic obstructive pulmonary disease) (HCC)   Sepsis due to methicillin resistant Staphylococcus aureus (MRSA) (HCC)   Healthcare-associated pneumonia   1. Acute on chronic respiratory failure with hypoxia patient has worsening oxygenation secondary to diffuse pneumonia at this time we will plan on doing a bronchoscopy however because of oxygen requirements PEEP requirements and blood requirements the bronc will be held since it is not safe at this time. 2. Chronic atrial fibrillation rate is controlled 3. Healthcare associated pneumonia worsening on the left side patient's antibiotics have been adjusted 4. Sepsis due to MRSA treated 5. COPD severe disease we will continue with present management prognosis guarded   I have personally seen and evaluated the patient, evaluated laboratory and imaging results, formulated the assessment and plan and placed orders. The Patient requires high complexity decision making for assessment and support.  Case was discussed on Rounds with the Respiratory Therapy Staff time 35 minutes including coordination of care with primary care team and discussion with respiratory staff as well as medical staff  Allyne Gee, MD [facility] Pulmonary Critical Care Medicine Sleep Medicine

## 2017-10-06 LAB — BPAM RBC
Blood Product Expiration Date: 201907252359
ISSUE DATE / TIME: 201906261238
UNIT TYPE AND RH: 5100

## 2017-10-06 LAB — TYPE AND SCREEN
ABO/RH(D): O POS
Antibody Screen: NEGATIVE
Unit division: 0

## 2017-10-06 LAB — HEMOGLOBIN AND HEMATOCRIT, BLOOD
HCT: 26.4 % — ABNORMAL LOW (ref 39.0–52.0)
Hemoglobin: 8.2 g/dL — ABNORMAL LOW (ref 13.0–17.0)

## 2017-10-06 LAB — PROTIME-INR
INR: 1.54
PROTHROMBIN TIME: 18.4 s — AB (ref 11.4–15.2)

## 2017-10-06 NOTE — Progress Notes (Addendum)
Patient ID: Dwayne Griffin, male   DOB: 10-14-1940, 77 y.o.   MRN: [id number]  Risks and benefits discussed with the patient's wife at bedside including, but not limited to the need for a barium enema during the procedure, bleeding, infection, peritonitis, or damage to adjacent structures. All of her questions were answered, she is agreeable to proceed. Consent signed and in chart.  Scheduled for possible G tube in IR 6/28  INR 1.5 today Afeb Wbc wnl  Barium per NG tonight at 6pm  Will check KUB in am And recheck INR

## 2017-10-07 ENCOUNTER — Other Ambulatory Visit (HOSPITAL_COMMUNITY): Payer: Self-pay

## 2017-10-07 ENCOUNTER — Encounter (HOSPITAL_COMMUNITY): Payer: Self-pay | Admitting: Interventional Radiology

## 2017-10-07 DIAGNOSIS — J9621 Acute and chronic respiratory failure with hypoxia: Secondary | ICD-10-CM | POA: Diagnosis not present

## 2017-10-07 DIAGNOSIS — I482 Chronic atrial fibrillation: Secondary | ICD-10-CM | POA: Diagnosis not present

## 2017-10-07 DIAGNOSIS — A4102 Sepsis due to Methicillin resistant Staphylococcus aureus: Secondary | ICD-10-CM | POA: Diagnosis not present

## 2017-10-07 DIAGNOSIS — J189 Pneumonia, unspecified organism: Secondary | ICD-10-CM | POA: Diagnosis not present

## 2017-10-07 DIAGNOSIS — J449 Chronic obstructive pulmonary disease, unspecified: Secondary | ICD-10-CM | POA: Diagnosis not present

## 2017-10-07 HISTORY — PX: IR GASTROSTOMY TUBE MOD SED: IMG625

## 2017-10-07 LAB — PROTIME-INR
INR: 1.54
PROTHROMBIN TIME: 18.4 s — AB (ref 11.4–15.2)

## 2017-10-07 MED ORDER — HYDROMORPHONE HCL 1 MG/ML IJ SOLN: 1.0000 mg | INTRAMUSCULAR | Status: DC | PRN

## 2017-10-07 MED ORDER — LIDOCAINE HCL (PF) 1 % IJ SOLN
INTRAMUSCULAR | Status: AC | PRN
  Administered 2017-10-07: 10 mL

## 2017-10-07 MED ORDER — FENTANYL CITRATE (PF) 100 MCG/2ML IJ SOLN
INTRAMUSCULAR | Status: AC | PRN
  Administered 2017-10-07: 25 ug via INTRAVENOUS

## 2017-10-07 MED ORDER — ONDANSETRON HCL 4 MG/2ML IJ SOLN: 4.0000 mg | INTRAMUSCULAR | Status: DC | PRN

## 2017-10-07 MED ORDER — GLUCAGON HCL RDNA (DIAGNOSTIC) 1 MG IJ SOLR
INTRAMUSCULAR | Status: AC | PRN
  Administered 2017-10-07: .5 mg via INTRAVENOUS

## 2017-10-07 MED ORDER — CEFAZOLIN SODIUM-DEXTROSE 2-4 GM/100ML-% IV SOLN
INTRAVENOUS | Status: AC
  Administered 2017-10-07: 2 g
  Filled 2017-10-07: qty 100

## 2017-10-07 MED ORDER — MIDAZOLAM HCL 2 MG/2ML IJ SOLN
INTRAMUSCULAR | Status: AC
  Filled 2017-10-07: qty 2

## 2017-10-07 MED ORDER — IOPAMIDOL (ISOVUE-300) INJECTION 61%
INTRAVENOUS | Status: AC
  Administered 2017-10-07: 15 mL
  Filled 2017-10-07: qty 50

## 2017-10-07 MED ORDER — MIDAZOLAM HCL 2 MG/2ML IJ SOLN
INTRAMUSCULAR | Status: AC | PRN
  Administered 2017-10-07: 1 mg via INTRAVENOUS

## 2017-10-07 MED ORDER — GLUCAGON HCL RDNA (DIAGNOSTIC) 1 MG IJ SOLR
INTRAMUSCULAR | Status: AC
  Filled 2017-10-07: qty 1

## 2017-10-07 MED ORDER — HYDROCODONE-ACETAMINOPHEN 5-325 MG PO TABS: 1.0000 | ORAL_TABLET | ORAL | Status: DC | PRN

## 2017-10-07 MED ORDER — FENTANYL CITRATE (PF) 100 MCG/2ML IJ SOLN
INTRAMUSCULAR | Status: AC
  Filled 2017-10-07: qty 2

## 2017-10-07 MED ORDER — LIDOCAINE HCL 1 % IJ SOLN
INTRAMUSCULAR | Status: AC
  Filled 2017-10-07: qty 20

## 2017-10-07 NOTE — Progress Notes (Signed)
Pulmonary [place]   PULMONARY SERVICE  PROGRESS NOTE  Date of Service: 10/07/2017  DRAVEN NATTER  YJE:[id number]  DOB: [birth year]   DOA: 10/09/2017  Referring Physician: Merton Border, MD  HPI: Dwayne Griffin is a 77 y.o. male seen for follow up of Acute on Chronic Respiratory Failure.  Patient remains on full vent support today patient's oxygen was decreased down to 60% right now is comfortable without distress.  However still requiring to be on assist control of the mechanics for spontaneous breathing index has been poor  Medications: Reviewed on Rounds  Physical Exam:  Vitals: Temperature 98.4 pulse 102 respiratory rate 20 blood pressure 102/52 saturations 96%  Ventilator Settings mode of ventilation assist control FiO2 60% tidal volume 509 PEEP 8  . General: Comfortable at this time . Eyes: Grossly normal lids, irises & conjunctiva . ENT: grossly tongue is normal . Neck: no obvious mass . Cardiovascular: S1 S2 normal no gallop . Respiratory: No rhonchi are noted at this time . Abdomen: soft . Skin: no rash seen on limited exam . Musculoskeletal: not rigid . Psychiatric:unable to assess . Neurologic: no seizure no involuntary movements         Lab Data:   Basic Metabolic Panel: Recent Labs  Lab 10/01/17 0758 10/02/17 2325 10/04/17 0731 10/04/17 2058 10/05/17 0702  NA 136 137  --   --  141  K 3.2* 2.8* 3.4* 5.2* 3.4*  CL 101 100*  --   --  101  CO2 29 31  --   --  32  GLUCOSE 154* 90  --   --  144*  BUN 11 7  --   --  8  CREATININE 0.45* 0.36*  --   --  0.42*  CALCIUM 8.4* 8.3*  --   --  8.9    Liver Function Tests: No results for input(s): AST, ALT, ALKPHOS, BILITOT, PROT, ALBUMIN in the last 168 hours. No results for input(s): LIPASE, AMYLASE in the last 168 hours. No results for input(s): AMMONIA in the last 168 hours.  CBC: Recent Labs  Lab 10/02/17 2325 10/05/17 0702 10/06/17 0712  WBC 16.1* 8.6  --    HGB 7.3* 6.9* 8.2*  HCT 23.7* 23.3* 26.4*  MCV 89.8 91.4  --   PLT 243 182  --     Cardiac Enzymes: No results for input(s): CKTOTAL, CKMB, CKMBINDEX, TROPONINI in the last 168 hours.  BNP (last 3 results) No results for input(s): BNP in the last 8760 hours.  ProBNP (last 3 results) No results for input(s): PROBNP in the last 8760 hours.  Radiological Exams: Dg Abd Portable 1v  Result Date: 10/07/2017 CLINICAL DATA:  pain EXAM: PORTABLE ABDOMEN - 1 VIEW COMPARISON:  10/04/2017 FINDINGS: A nasogastric tube is in place, tip overlying the level of the gastroesophageal junction. Consider advancing to further into the stomach by 10 centimeters. There is residual contrast in the bowel. Bowel gas pattern appears non obstructed. IMPRESSION: Residual contrast in the bowel, presumably following CT of the abdomen and pelvis on 09/24/2017. Consider bowel preparation to clear contrast. Electronically Signed   By: Nolon Nations M.D.   On: 10/07/2017 09:15    Assessment/Plan Principal Problem:   Acute on chronic respiratory failure with hypoxemia (HCC) Active Problems:   Chronic atrial fibrillation (HCC)   COPD (chronic obstructive pulmonary disease) (HCC)   Sepsis due to methicillin resistant Staphylococcus aureus (MRSA) ([place])   Healthcare-associated pneumonia   1. Acute  on chronic respiratory failure with hypoxia we will continue with full support on assist control.  Currently is on 60% FiO2 with tidal volume of 509 and a PEEP of 8 we will continue pulmonary toilet secretion management. 2. Chronic atrial fibrillation rate is controlled at this time 3. COPD severe disease we will continue to follow 4. Sepsis due to MRSA treated 5. Healthcare associated pneumonia treated   I have personally seen and evaluated the patient, evaluated laboratory and imaging results, formulated the assessment and plan and placed orders. The Patient requires high complexity decision making for assessment and  support.  Case was discussed on Rounds with the Respiratory Therapy Staff  Allyne Gee, MD [facility] Pulmonary Critical Care Medicine Sleep Medicine

## 2017-10-07 NOTE — Procedures (Signed)
  Procedure: Perc gastrostomy tube 41f EBL:   minimal Complications:  none immediate  See full dictation in [facility].  Dillard Cannon MD Main # [phone] Pager  [phone]

## 2017-10-08 ENCOUNTER — Other Ambulatory Visit (HOSPITAL_COMMUNITY): Payer: Self-pay

## 2017-10-08 DIAGNOSIS — J449 Chronic obstructive pulmonary disease, unspecified: Secondary | ICD-10-CM | POA: Diagnosis not present

## 2017-10-08 DIAGNOSIS — I482 Chronic atrial fibrillation: Secondary | ICD-10-CM | POA: Diagnosis not present

## 2017-10-08 DIAGNOSIS — J189 Pneumonia, unspecified organism: Secondary | ICD-10-CM | POA: Diagnosis not present

## 2017-10-08 DIAGNOSIS — J9621 Acute and chronic respiratory failure with hypoxia: Secondary | ICD-10-CM | POA: Diagnosis not present

## 2017-10-08 LAB — CBC
HEMATOCRIT: 24.7 % — AB (ref 39.0–52.0)
Hemoglobin: 7.5 g/dL — ABNORMAL LOW (ref 13.0–17.0)
MCH: 28 pg (ref 26.0–34.0)
MCHC: 30.4 g/dL (ref 30.0–36.0)
MCV: 92.2 fL (ref 78.0–100.0)
Platelets: 28 10*3/uL — CL (ref 150–400)
RBC: 2.68 MIL/uL — ABNORMAL LOW (ref 4.22–5.81)
RDW: 19.7 % — AB (ref 11.5–15.5)
WBC: 14.2 10*3/uL — ABNORMAL HIGH (ref 4.0–10.5)

## 2017-10-08 LAB — BASIC METABOLIC PANEL
Anion gap: 8 (ref 5–15)
BUN: 14 mg/dL (ref 8–23)
CALCIUM: 8.6 mg/dL — AB (ref 8.9–10.3)
CO2: 29 mmol/L (ref 22–32)
CREATININE: 0.57 mg/dL — AB (ref 0.61–1.24)
Chloride: 99 mmol/L (ref 98–111)
GFR calc Af Amer: >60 mL/min (ref >60)
GFR calc non Af Amer: >60 mL/min (ref >60)
GLUCOSE: 181 mg/dL — AB (ref 70–99)
Potassium: 2.8 mmol/L — ABNORMAL LOW (ref 3.5–5.1)
Sodium: 136 mmol/L (ref 135–145)

## 2017-10-08 LAB — PROTIME-INR
INR: 1.71
Prothrombin Time: 19.9 seconds — ABNORMAL HIGH (ref 11.4–15.2)

## 2017-10-08 NOTE — Progress Notes (Signed)
Pulmonary [place]   PULMONARY SERVICE  PROGRESS NOTE  Date of Service: 10/08/2017  Dwayne Griffin  BJY:[id number]  DOB: 1940-07-03   DOA: 10/02/2017  Referring Physician: Merton Border, MD  HPI: Dwayne Griffin is a 77 y.o. male seen for follow up of Acute on Chronic Respiratory Failure.  Remains on full vent support we are able to decrease the oxygen down to 50% but the patient still is not doing very well as far as being able to tolerate any weaning at this stage.  Medications: Reviewed on Rounds  Physical Exam:  Vitals:   Temperature is 98.8 degrees pulse 107 as tori rate 26 blood pressure 102/55 saturations 95%  Ventilator Settings  Mode of ventilation assist-control FiO2 50% tidal volume 502 peep 5  . General: Comfortable at this time . Eyes: Grossly normal lids, irises & conjunctiva . ENT: grossly tongue is normal . Neck: no obvious mass . Cardiovascular: S1 S2 normal no gallop . Respiratory:  No rhonchi or rales are noted at this time. . Abdomen: soft . Skin: no rash seen on limited exam . Musculoskeletal: not rigid . Psychiatric:unable to assess . Neurologic: no seizure no involuntary movements         Lab Data:   Basic Metabolic Panel: Recent Labs  Lab 10/02/17 2325 10/04/17 0731 10/04/17 2058 10/05/17 0702 10/08/17 1238  NA 137  --   --  141 136  K 2.8* 3.4* 5.2* 3.4* 2.8*  CL 100*  --   --  101 99  CO2 31  --   --  32 29  GLUCOSE 90  --   --  144* 181*  BUN 7  --   --  8 14  CREATININE 0.36*  --   --  0.42* 0.57*  CALCIUM 8.3*  --   --  8.9 8.6*    Liver Function Tests: No results for input(s): AST, ALT, ALKPHOS, BILITOT, PROT, ALBUMIN in the last 168 hours. No results for input(s): LIPASE, AMYLASE in the last 168 hours. No results for input(s): AMMONIA in the last 168 hours.  CBC: Recent Labs  Lab 10/02/17 2325 10/05/17 0702 10/06/17 0712 10/08/17 1238  WBC 16.1* 8.6  --  14.2*  HGB 7.3* 6.9*  8.2* 7.5*  HCT 23.7* 23.3* 26.4* 24.7*  MCV 89.8 91.4  --  92.2  PLT 243 182  --  28*    Cardiac Enzymes: No results for input(s): CKTOTAL, CKMB, CKMBINDEX, TROPONINI in the last 168 hours.  BNP (last 3 results) No results for input(s): BNP in the last 8760 hours.  ProBNP (last 3 results) No results for input(s): PROBNP in the last 8760 hours.  Radiological Exams: Ir Gastrostomy Tube Mod Sed  Result Date: 10/07/2017 CLINICAL DATA:  Chronic respiratory insufficiency and intubation, needs enteral feeding support EXAM: PERC PLACEMENT GASTROSTOMY FLUOROSCOPY TIME:  3.3 minutes; 385 uGym2 DAP TECHNIQUE: The procedure, risks, benefits, and alternatives were explained to the family. Questions regarding the procedure were encouraged and answered. The family understands and consents to the procedure. As antibiotic prophylaxis, cefazolin 2 g was ordered pre-procedure and administered intravenously within one hour of incision.Progression of previously administered oral barium out of the stomach was confirmed fluoroscopically. A safe percutaneous approach was confirmed on recent cross-sectional imaging. A 5 French angiographic catheter was placed as orogastric tube. The upper abdomen was prepped with Betadine, draped in usual sterile fashion, and infiltrated locally with 1% lidocaine. Intravenous Fentanyl and Versed were administered  as conscious sedation during continuous monitoring of the patient's level of consciousness and physiological / cardiorespiratory status by the radiology RN, with a total moderate sedation time of 10 minutes. 0.5 mg glucagon administered to facilitate gastric distention. Stomach was insufflated using air through the orogastric tube. An 92 French sheath needle was advanced percutaneously into the gastric lumen under fluoroscopy. Gas could be aspirated and a small contrast injection confirmed intraluminal spread. The sheath was exchanged over a guidewire for a 9 [place] vascular  sheath, through which the snare device was advanced and used to snare a guidewire passed through the orogastric tube. This was withdrawn, and the snare attached to the 20 French pull-through gastrostomy tube, which was advanced antegrade, positioned with the internal bumper securing the anterior gastric wall to the anterior abdominal wall. Small contrast injection confirms appropriate positioning. The external bumper was applied and the catheter was flushed. COMPLICATIONS: COMPLICATIONS none IMPRESSION: 1. Technically successful 20 French pull-through gastrostomy placement under fluoroscopy. Electronically Signed   By: Lucrezia Europe M.D.   On: 10/07/2017 15:47   Dg Chest Port 1 View  Result Date: 10/08/2017 CLINICAL DATA:  Respiratory failure. EXAM: PORTABLE CHEST 1 VIEW COMPARISON:  Chest x-ray dated October 03, 2017. FINDINGS: The patient remains mildly rotated to the right. Unchanged tracheostomy tube. Interval removal of the enteric tube. Stable obscuration of the cardiomediastinal silhouette. 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 Diffuse airspace disease throughout the entire left lung is unchanged. The right lung is mostly clear. Small bilateral pleural effusions persist. No pneumothorax. No acute osseous abnormality. IMPRESSION: Unchanged diffuse airspace disease throughout the entire left lung. Electronically Signed   By: Titus Dubin M.D.   On: 10/08/2017 17:28   Dg Abd Portable 1v  Result Date: 10/07/2017 CLINICAL DATA:  pain EXAM: PORTABLE ABDOMEN - 1 VIEW COMPARISON:  10/04/2017 FINDINGS: A nasogastric tube is in place, tip overlying the level of the gastroesophageal junction. Consider advancing to further into the stomach by 10 centimeters. There is residual contrast in the bowel. Bowel gas pattern appears non obstructed. IMPRESSION: Residual contrast in the bowel, presumably following CT of the abdomen and pelvis  on 09/24/2017. Consider bowel preparation to clear contrast. Electronically Signed   By: Nolon Nations M.D.   On: 10/07/2017 09:15    Assessment/Plan Principal Problem:   Acute on chronic respiratory failure with hypoxemia (HCC) Active Problems:   Chronic atrial fibrillation (HCC)   COPD (chronic obstructive pulmonary disease) (HCC)   Sepsis due to methicillin resistant Staphylococcus aureus (MRSA) ([place])   Healthcare-associated pneumonia   1.   Acute on chronic Respiratory failure with hypoxia continue to assess the RSBI and the mechanics.  If patient is able to tolerate will try to advance weaning.   2. Chronic atrial fibrillation rate is controlled we will continue to monitor.   3. COPD stable at this time will follow along 4. Sepsis secondary to MRSA treated with antibiotics 5. Healthcare associated pneumonia treated Will continue with supportive care prognosis is guarded will get a followup chest x-ray to assess progression at this stage.   I have personally seen and evaluated the patient, evaluated laboratory and imaging results, formulated the assessment and plan and placed orders. The Patient requires high complexity decision making for assessment and support.  Case was discussed on Rounds with the Respiratory Therapy Staff  Allyne Gee, MD [facility] Pulmonary Critical Care Medicine Sleep Medicine

## 2017-10-09 DIAGNOSIS — I482 Chronic atrial fibrillation: Secondary | ICD-10-CM | POA: Diagnosis not present

## 2017-10-09 DIAGNOSIS — J9621 Acute and chronic respiratory failure with hypoxia: Secondary | ICD-10-CM | POA: Diagnosis not present

## 2017-10-09 DIAGNOSIS — J189 Pneumonia, unspecified organism: Secondary | ICD-10-CM | POA: Diagnosis not present

## 2017-10-09 DIAGNOSIS — J449 Chronic obstructive pulmonary disease, unspecified: Secondary | ICD-10-CM | POA: Diagnosis not present

## 2017-10-09 LAB — BASIC METABOLIC PANEL
Anion gap: 6 (ref 5–15)
BUN: 19 mg/dL (ref 8–23)
CALCIUM: 8.6 mg/dL — AB (ref 8.9–10.3)
CHLORIDE: 100 mmol/L (ref 98–111)
CO2: 30 mmol/L (ref 22–32)
Creatinine, Ser: 0.63 mg/dL (ref 0.61–1.24)
GFR calc Af Amer: >60 mL/min (ref >60)
Glucose, Bld: 167 mg/dL — ABNORMAL HIGH (ref 70–99)
Potassium: 3.5 mmol/L (ref 3.5–5.1)
Sodium: 136 mmol/L (ref 135–145)

## 2017-10-09 LAB — PREPARE RBC (CROSSMATCH)

## 2017-10-09 LAB — CREATININE, SERUM
Creatinine, Ser: 0.6 mg/dL — ABNORMAL LOW (ref 0.61–1.24)
GFR calc non Af Amer: >60 mL/min (ref >60)

## 2017-10-09 LAB — CBC
HCT: 21.6 % — ABNORMAL LOW (ref 39.0–52.0)
HEMATOCRIT: 22.8 % — AB (ref 39.0–52.0)
HEMOGLOBIN: 6.8 g/dL — AB (ref 13.0–17.0)
HEMOGLOBIN: 6.6 g/dL — AB (ref 13.0–17.0)
MCH: 28.5 pg (ref 26.0–34.0)
MCH: 28.3 pg (ref 26.0–34.0)
MCHC: 29.8 g/dL — AB (ref 30.0–36.0)
MCHC: 30.6 g/dL (ref 30.0–36.0)
MCV: 95.4 fL (ref 78.0–100.0)
MCV: 92.7 fL (ref 78.0–100.0)
Platelets: 11 10*3/uL — CL (ref 150–400)
Platelets: 7 10*3/uL — CL (ref 150–400)
RBC: 2.39 MIL/uL — ABNORMAL LOW (ref 4.22–5.81)
RBC: 2.33 MIL/uL — AB (ref 4.22–5.81)
RDW: 20.4 % — ABNORMAL HIGH (ref 11.5–15.5)
RDW: 20.1 % — ABNORMAL HIGH (ref 11.5–15.5)
WBC: 11.7 10*3/uL — ABNORMAL HIGH (ref 4.0–10.5)
WBC: 12 10*3/uL — AB (ref 4.0–10.5)

## 2017-10-09 LAB — PROTIME-INR
INR: 1.98
Prothrombin Time: 22.3 seconds — ABNORMAL HIGH (ref 11.4–15.2)

## 2017-10-09 LAB — HEPARIN INDUCED PLATELET AB (HIT ANTIBODY): HEPARIN INDUCED PLT AB: 0.257 {OD_unit} (ref 0.000–0.400)

## 2017-10-09 LAB — GENTAMICIN LEVEL, RANDOM: Gentamicin Rm: 6.2 ug/mL

## 2017-10-09 LAB — POTASSIUM: Potassium: 3.6 mmol/L (ref 3.5–5.1)

## 2017-10-09 NOTE — Progress Notes (Signed)
Pulmonary [place]   PULMONARY SERVICE  PROGRESS NOTE  Date of Service: 10/09/2017  Dwayne Griffin  FWY:[id number]  DOB: 08/24/1940   DOA: 10/03/2017  Referring Physician: Merton Border, MD  HPI: Dwayne Griffin is a 77 y.o. male seen for follow up of Acute on Chronic Respiratory Failure.  Patient remains on full support the chest x-ray looks really no better the left lung still is densely consolidated with increased infiltrates.  Patient remains on full vent support oxygen requirements are also palpable right now is on 55% FiO2  Medications: Reviewed on Rounds  Physical Exam:  Vitals: Temperature 98.6 pulse 108 respiratory rate 20 blood pressure 99/57 saturations 91%  Ventilator Settings mode of ventilation assist control FiO2 55% tidal volume 790 PEEP 8  . General: Comfortable at this time . Eyes: Grossly normal lids, irises & conjunctiva . ENT: grossly tongue is normal . Neck: no obvious mass . Cardiovascular: S1 S2 normal no gallop . Respiratory: No rhonchi or rales are noted at this time . Abdomen: soft . Skin: no rash seen on limited exam . Musculoskeletal: not rigid . Psychiatric:unable to assess . Neurologic: no seizure no involuntary movements         Lab Data:   Basic Metabolic Panel: Recent Labs  Lab 10/02/17 2325  10/04/17 2058 10/05/17 0702 10/08/17 1238 10/09/17 0500 10/09/17 0800 10/09/17 1129  NA 137  --   --  141 136  --  136  --   K 2.8*   < > 5.2* 3.4* 2.8*  --  3.5 3.6  CL 100*  --   --  101 99  --  100  --   CO2 31  --   --  32 29  --  30  --   GLUCOSE 90  --   --  144* 181*  --  167*  --   BUN 7  --   --  8 14  --  19  --   CREATININE 0.36*  --   --  0.42* 0.57* 0.60* 0.63  --   CALCIUM 8.3*  --   --  8.9 8.6*  --  8.6*  --    < > = values in this interval not displayed.    Liver Function Tests: No results for input(s): AST, ALT, ALKPHOS, BILITOT, PROT, ALBUMIN in the last 168 hours. No  results for input(s): LIPASE, AMYLASE in the last 168 hours. No results for input(s): AMMONIA in the last 168 hours.  CBC: Recent Labs  Lab 10/02/17 2325 10/05/17 0702 10/06/17 0712 10/08/17 1238 10/09/17 0800  WBC 16.1* 8.6  --  14.2* 11.7*  HGB 7.3* 6.9* 8.2* 7.5* 6.8*  HCT 23.7* 23.3* 26.4* 24.7* 22.8*  MCV 89.8 91.4  --  92.2 95.4  PLT 243 182  --  28* 11*    Cardiac Enzymes: No results for input(s): CKTOTAL, CKMB, CKMBINDEX, TROPONINI in the last 168 hours.  BNP (last 3 results) No results for input(s): BNP in the last 8760 hours.  ProBNP (last 3 results) No results for input(s): PROBNP in the last 8760 hours.  Radiological Exams: Dg Chest Port 1 View  Result Date: 10/08/2017 CLINICAL DATA:  Respiratory failure. EXAM: PORTABLE CHEST 1 VIEW COMPARISON:  Chest x-ray dated October 03, 2017. FINDINGS: The patient remains mildly rotated to the right. Unchanged tracheostomy tube. Interval removal of the enteric tube. Stable obscuration of the cardiomediastinal silhouette. 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 Diffuse airspace disease throughout the entire left lung is unchanged. The right lung is mostly clear. Small bilateral pleural effusions persist. No pneumothorax. No acute osseous abnormality. IMPRESSION: Unchanged diffuse airspace disease throughout the entire left lung. Electronically Signed   By: Titus Dubin M.D.   On: 10/08/2017 17:28    Assessment/Plan Principal Problem:   Acute on chronic respiratory failure with hypoxemia (HCC) Active Problems:   Chronic atrial fibrillation (HCC)   COPD (chronic obstructive pulmonary disease) (HCC)   Sepsis due to methicillin resistant Staphylococcus aureus (MRSA) (HCC)   Healthcare-associated pneumonia   1. Acute on chronic respiratory failure with hypoxia we will continue with full support right now not able to wean because of the dense  consolidation the patient has. 2. Severe COPD we will continue with present management prognosis guarded. 3. Chronic atrial fibrillation rate is controlled at this stage 4. Sepsis due to MRSA hemodynamically stable 5. Healthcare associated pneumonia does not appear to be clinically improvement in his oxygen requirements are little bit better he is on treatment for Pseudomonas now we will reassess the possibility of doing a bronchoscopy for better culture specimen   I have personally seen and evaluated the patient, evaluated laboratory and imaging results, formulated the assessment and plan and placed orders. The Patient requires high complexity decision making for assessment and support.  Case was discussed on Rounds with the Respiratory Therapy Staff  Allyne Gee, MD [facility] Pulmonary Critical Care Medicine Sleep Medicine

## 2017-10-10 DIAGNOSIS — I482 Chronic atrial fibrillation: Secondary | ICD-10-CM | POA: Diagnosis not present

## 2017-10-10 DIAGNOSIS — J449 Chronic obstructive pulmonary disease, unspecified: Secondary | ICD-10-CM | POA: Diagnosis not present

## 2017-10-10 DIAGNOSIS — J189 Pneumonia, unspecified organism: Secondary | ICD-10-CM | POA: Diagnosis not present

## 2017-10-10 DIAGNOSIS — J9621 Acute and chronic respiratory failure with hypoxia: Secondary | ICD-10-CM | POA: Diagnosis not present

## 2017-10-10 LAB — TYPE AND SCREEN
ABO/RH(D): O POS
Antibody Screen: NEGATIVE
Unit division: 0

## 2017-10-10 LAB — BPAM RBC
Blood Product Expiration Date: 201907292359
ISSUE DATE / TIME: 201906301713
Unit Type and Rh: 5100

## 2017-10-10 LAB — GENTAMICIN LEVEL, RANDOM: GENTAMICIN RM: 9.8 ug/mL

## 2017-10-10 LAB — PROTIME-INR
INR: 1.74
Prothrombin Time: 20.2 seconds — ABNORMAL HIGH (ref 11.4–15.2)

## 2017-10-10 NOTE — Progress Notes (Signed)
Pulmonary [place]   PULMONARY SERVICE  PROGRESS NOTE  Date of Service: 10/10/2017  Angeles Paolucci Keesey  QMV:[id number]  DOB: Jan 08, 1941   DOA: 09/28/2017  Referring Physician: Merton Border, MD  HPI: TRAESON DUSZA is a 77 y.o. male seen for follow up of Acute on Chronic Respiratory Failure.  Patient remains on the ventilator has been on 55% oxygen on full vent support and assist control mode.  Right now has a tidal volume 450 cc PEEP is still at 8  Medications: Reviewed on Rounds  Physical Exam:  Vitals: Temperature 99.1 pulse 112 respiratory rate 22 blood pressure 99/52 saturations 90%  Ventilator Settings mode of ventilation assist control FiO2 55% tidal volume 450 PEEP 8  . General: Comfortable at this time . Eyes: Grossly normal lids, irises & conjunctiva . ENT: grossly tongue is normal . Neck: no obvious mass . Cardiovascular: S1 S2 normal no gallop . Respiratory: No rhonchi or rales are noted at this time breath sounds are coarse . Abdomen: soft . Skin: no rash seen on limited exam . Musculoskeletal: not rigid . Psychiatric:unable to assess . Neurologic: no seizure no involuntary movements         Lab Data:   Basic Metabolic Panel: Recent Labs  Lab 10/04/17 2058 10/05/17 0702 10/08/17 1238 10/09/17 0500 10/09/17 0800 10/09/17 1129  NA  --  141 136  --  136  --   K 5.2* 3.4* 2.8*  --  3.5 3.6  CL  --  101 99  --  100  --   CO2  --  32 29  --  30  --   GLUCOSE  --  144* 181*  --  167*  --   BUN  --  8 14  --  19  --   CREATININE  --  0.42* 0.57* 0.60* 0.63  --   CALCIUM  --  8.9 8.6*  --  8.6*  --     Liver Function Tests: No results for input(s): AST, ALT, ALKPHOS, BILITOT, PROT, ALBUMIN in the last 168 hours. No results for input(s): LIPASE, AMYLASE in the last 168 hours. No results for input(s): AMMONIA in the last 168 hours.  CBC: Recent Labs  Lab 10/05/17 0702 10/06/17 0712 10/08/17 1238 10/09/17 0800  10/09/17 1558  WBC 8.6  --  14.2* 11.7* 12.0*  HGB 6.9* 8.2* 7.5* 6.8* 6.6*  HCT 23.3* 26.4* 24.7* 22.8* 21.6*  MCV 91.4  --  92.2 95.4 92.7  PLT 182  --  28* 11* 7*    Cardiac Enzymes: No results for input(s): CKTOTAL, CKMB, CKMBINDEX, TROPONINI in the last 168 hours.  BNP (last 3 results) No results for input(s): BNP in the last 8760 hours.  ProBNP (last 3 results) No results for input(s): PROBNP in the last 8760 hours.  Radiological Exams: Dg Chest Port 1 View  Result Date: 10/08/2017 CLINICAL DATA:  Respiratory failure. EXAM: PORTABLE CHEST 1 VIEW COMPARISON:  Chest x-ray dated October 03, 2017. FINDINGS: The patient remains mildly rotated to the right. Unchanged tracheostomy tube. Interval removal of the enteric tube. Stable obscuration of the cardiomediastinal silhouette. 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 Diffuse airspace disease throughout the entire left lung is unchanged. The right lung is mostly clear. Small bilateral pleural effusions persist. No pneumothorax. No acute osseous abnormality. IMPRESSION: Unchanged diffuse airspace disease throughout the entire left lung. Electronically Signed  By: Titus Dubin M.D.   On: 10/08/2017 17:28    Assessment/Plan Principal Problem:   Acute on chronic respiratory failure with hypoxemia (HCC) Active Problems:   Chronic atrial fibrillation (HCC)   COPD (chronic obstructive pulmonary disease) (HCC)   Sepsis due to methicillin resistant Staphylococcus aureus (MRSA) (HCC)   Healthcare-associated pneumonia   1. Acute on chronic respiratory failure with hypoxia patient continues to be vent dependent has not been able to tolerate any weaning attempts.  Patient's pneumonia is not showing much of improvement. 2. Healthcare associated pneumonia treated with antibiotics but not really showing much improvement.  He is still I think not stable enough to do a  bronchoscopy on.  He has consistently grown Pseudomonas from his cultures which he has been treated with but not responding. 3. Chronic atrial fibrillation rate is controlled we will continue present supportive care. 4. Sepsis hemodynamically his blood pressure is little bit more tenuous continue with supportive care and pressors as needed. 5. COPD severe disease his wife is now considering possibility of comfort care will discuss further with the primary care team   I have personally seen and evaluated the patient, evaluated laboratory and imaging results, formulated the assessment and plan and placed orders. The Patient requires high complexity decision making for assessment and support.  Case was discussed on Rounds with the Respiratory Therapy Staff  Allyne Gee, MD [facility] Pulmonary Critical Care Medicine Sleep Medicine

## 2017-10-10 DEATH — deceased

## 2017-10-11 LAB — BASIC METABOLIC PANEL
Anion gap: 8 (ref 5–15)
BUN: 24 mg/dL — AB (ref 8–23)
CHLORIDE: 100 mmol/L (ref 98–111)
CO2: 25 mmol/L (ref 22–32)
Calcium: 8.3 mg/dL — ABNORMAL LOW (ref 8.9–10.3)
Creatinine, Ser: 0.78 mg/dL (ref 0.61–1.24)
GFR calc non Af Amer: >60 mL/min (ref >60)
GLUCOSE: 178 mg/dL — AB (ref 70–99)
Potassium: 4 mmol/L (ref 3.5–5.1)
Sodium: 133 mmol/L — ABNORMAL LOW (ref 135–145)

## 2017-10-11 LAB — CBC
HEMATOCRIT: 24.7 % — AB (ref 39.0–52.0)
HEMOGLOBIN: 7.6 g/dL — AB (ref 13.0–17.0)
MCH: 28.8 pg (ref 26.0–34.0)
MCHC: 30.8 g/dL (ref 30.0–36.0)
MCV: 93.6 fL (ref 78.0–100.0)
Platelets: 5 10*3/uL — CL (ref 150–400)
RBC: 2.64 MIL/uL — ABNORMAL LOW (ref 4.22–5.81)
RDW: 20.2 % — ABNORMAL HIGH (ref 11.5–15.5)
WBC: 13.4 10*3/uL — ABNORMAL HIGH (ref 4.0–10.5)

## 2017-10-11 LAB — PROTIME-INR
INR: 1.71
Prothrombin Time: 19.9 seconds — ABNORMAL HIGH (ref 11.4–15.2)

## 2017-10-27 ENCOUNTER — Telehealth: Payer: Self-pay

## 2017-10-27 NOTE — Telephone Encounter (Signed)
Left message for spouse to call back; she had a question about his bill.tat

## 2017-11-10 DEATH — deceased

## 2019-06-12 IMAGING — XA IR PERC PLACEMENT GASTROSTOMY
2 series · 3 of 3 positions shown · non-contrast
Comparison: none

CLINICAL DATA: Chronic respiratory insufficiency and intubation,
needs enteral feeding support

EXAM:
PERC PLACEMENT GASTROSTOMY
FLUOROSCOPY TIME:  3.3 minutes; 385 uFymG DAP
TECHNIQUE: The procedure, risks, benefits, and alternatives were explained to
the family. Questions regarding the procedure were encouraged and
answered. The family understands and consents to the procedure.

[Series 1: fl (-) angio · 1 of 1 slices shown (1 of 2)]
[im 1/1]
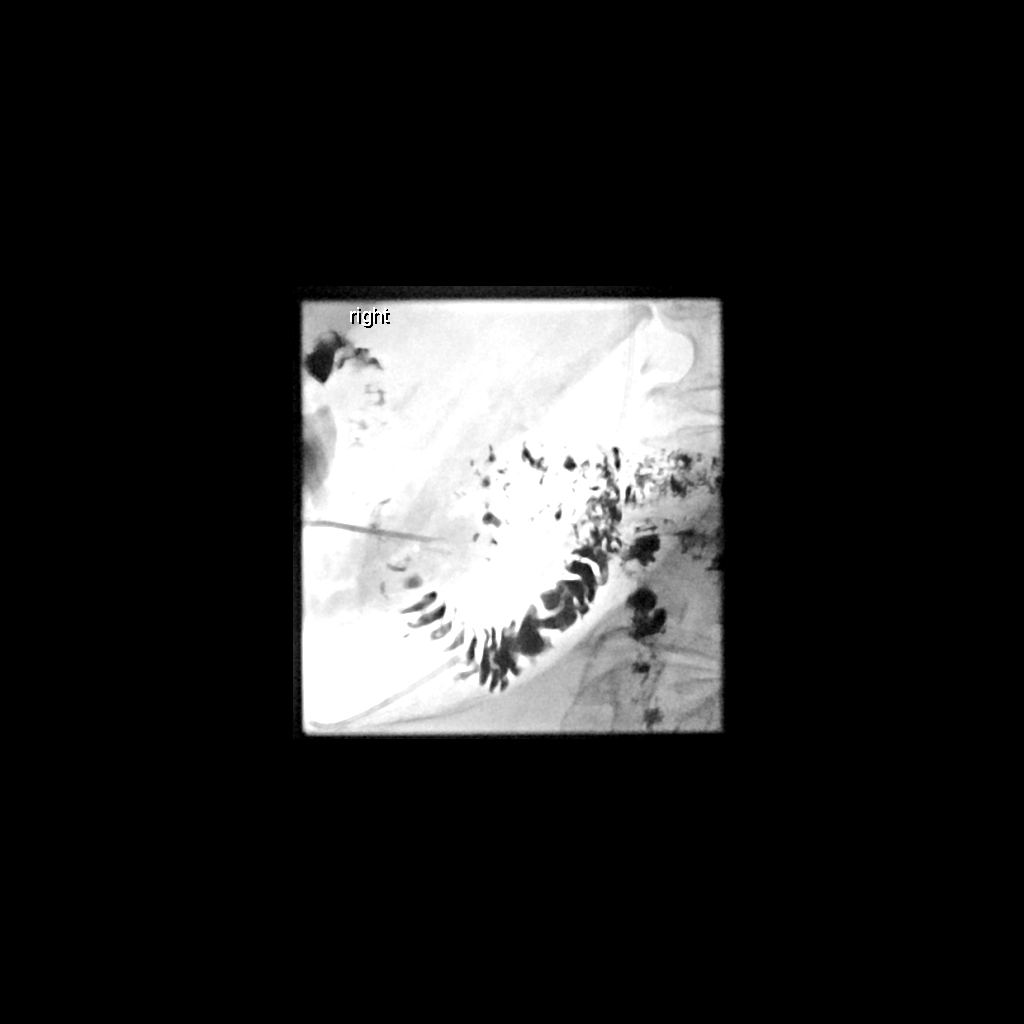

[Series 2: fl (-) angio · 2 of 2 slices shown (2 of 2)]
[im 1/2]
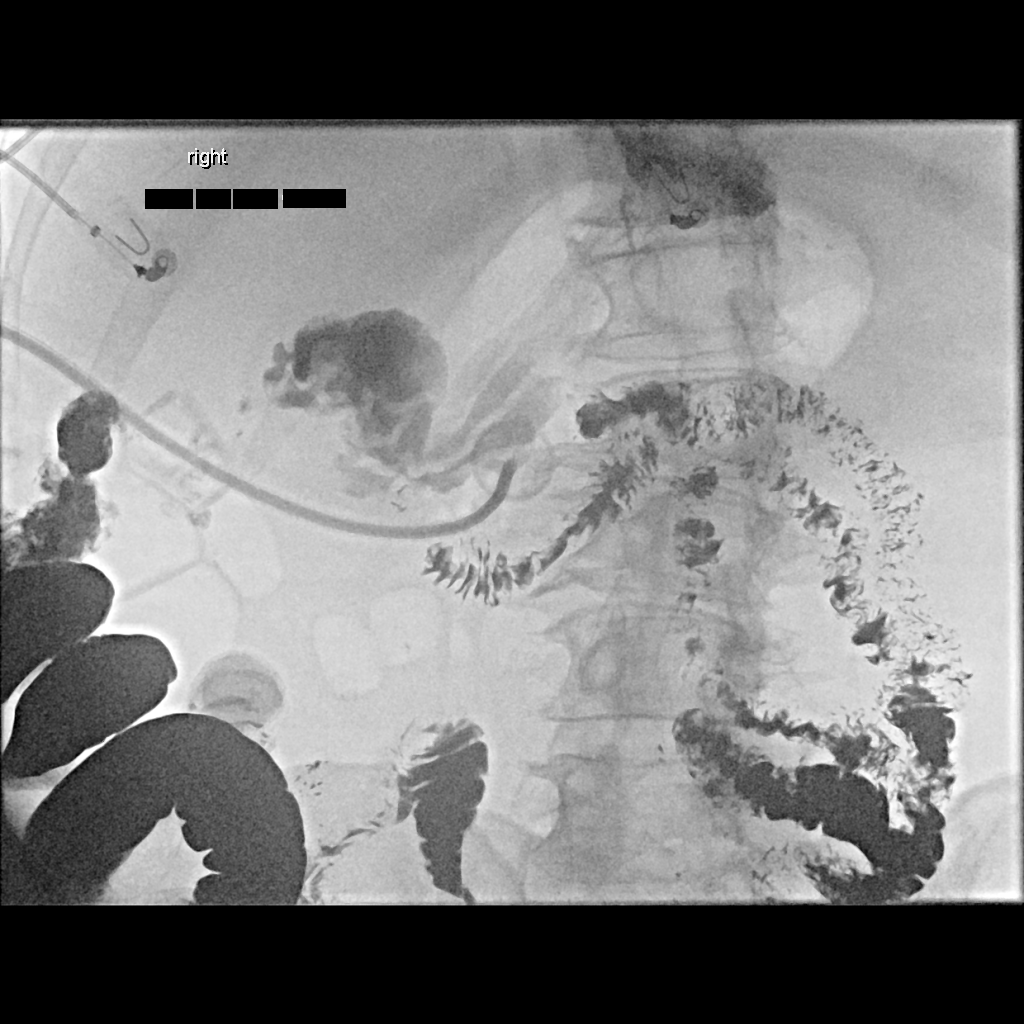
[im 2/2]
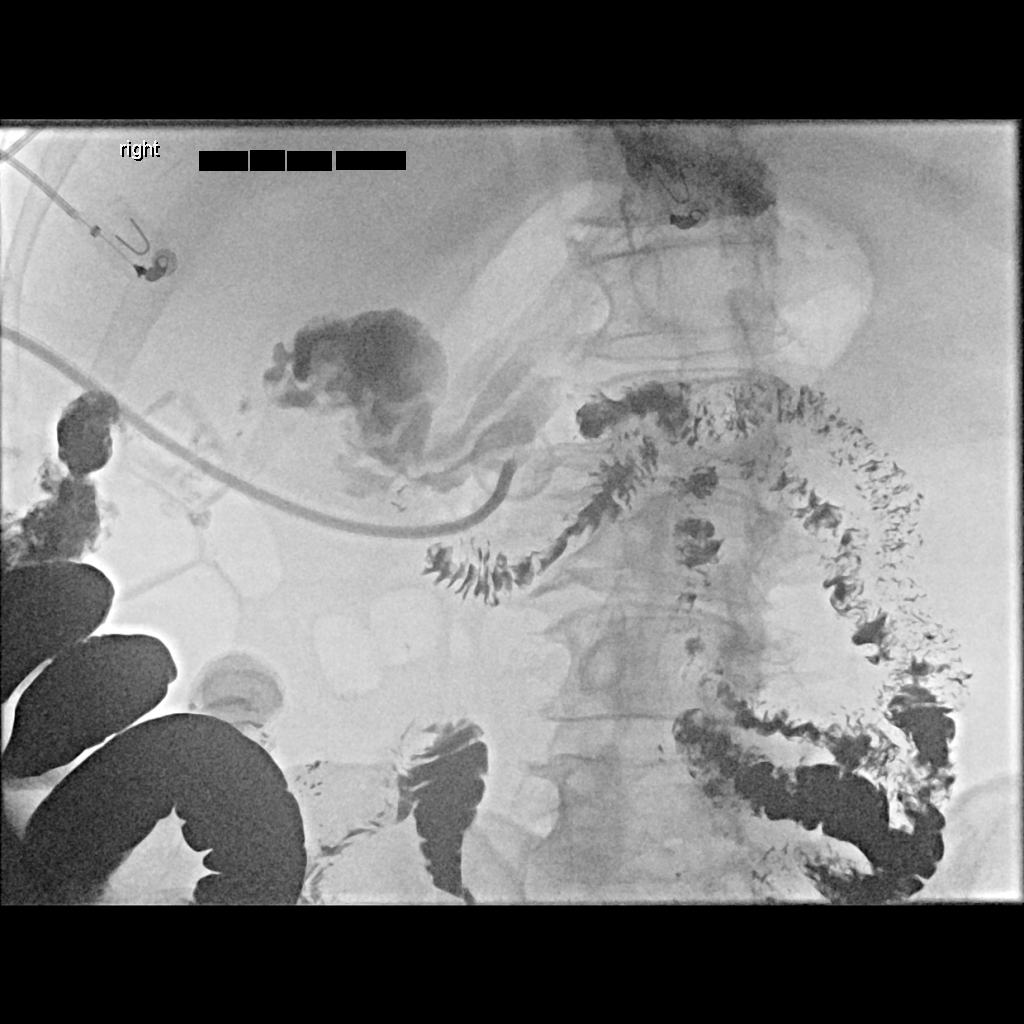

[3 of 3 positions shown; findings below may reference images not displayed]

As antibiotic prophylaxis, cefazolin 2 g was ordered pre-procedure
and administered intravenously within one hour of
incision.Progression of previously administered oral barium out of
the stomach was confirmed fluoroscopically. A safe percutaneous
approach was confirmed on recent cross-sectional imaging.

A 5 French angiographic catheter was placed as orogastric tube. The
upper abdomen was prepped with Betadine, draped in usual sterile
fashion, and infiltrated locally with 1% lidocaine.

Intravenous Fentanyl and Versed were administered as conscious
sedation during continuous monitoring of the patient's level of
consciousness and physiological / cardiorespiratory status by the
radiology RN, with a total moderate sedation time of 10 minutes.
mg glucagon administered to facilitate gastric distention.

Stomach was insufflated using air through the orogastric tube. An 18
French sheath needle was advanced percutaneously into the gastric
lumen under fluoroscopy. Gas could be aspirated and a small contrast
injection confirmed intraluminal spread. The sheath was exchanged
over a guidewire for a 9 French vascular sheath, through which the
snare device was advanced and used to snare a guidewire passed
through the orogastric tube. This was withdrawn, and the snare
attached to the 20 French pull-through gastrostomy tube, which was
advanced antegrade, positioned with the internal bumper securing the
anterior gastric wall to the anterior abdominal wall. Small contrast
injection confirms appropriate positioning. The external bumper was
applied and the catheter was flushed.

COMPLICATIONS:
COMPLICATIONS
none
IMPRESSION: 1. Technically successful 20 French pull-through gastrostomy
placement under fluoroscopy.

## 2019-06-12 IMAGING — CR DG ABD PORTABLE 1V
1 series · 1 of 1 positions shown · non-contrast
Comparison: 10/04/2017

CLINICAL DATA: pain

EXAM:
PORTABLE ABDOMEN - 1 VIEW

[AP]
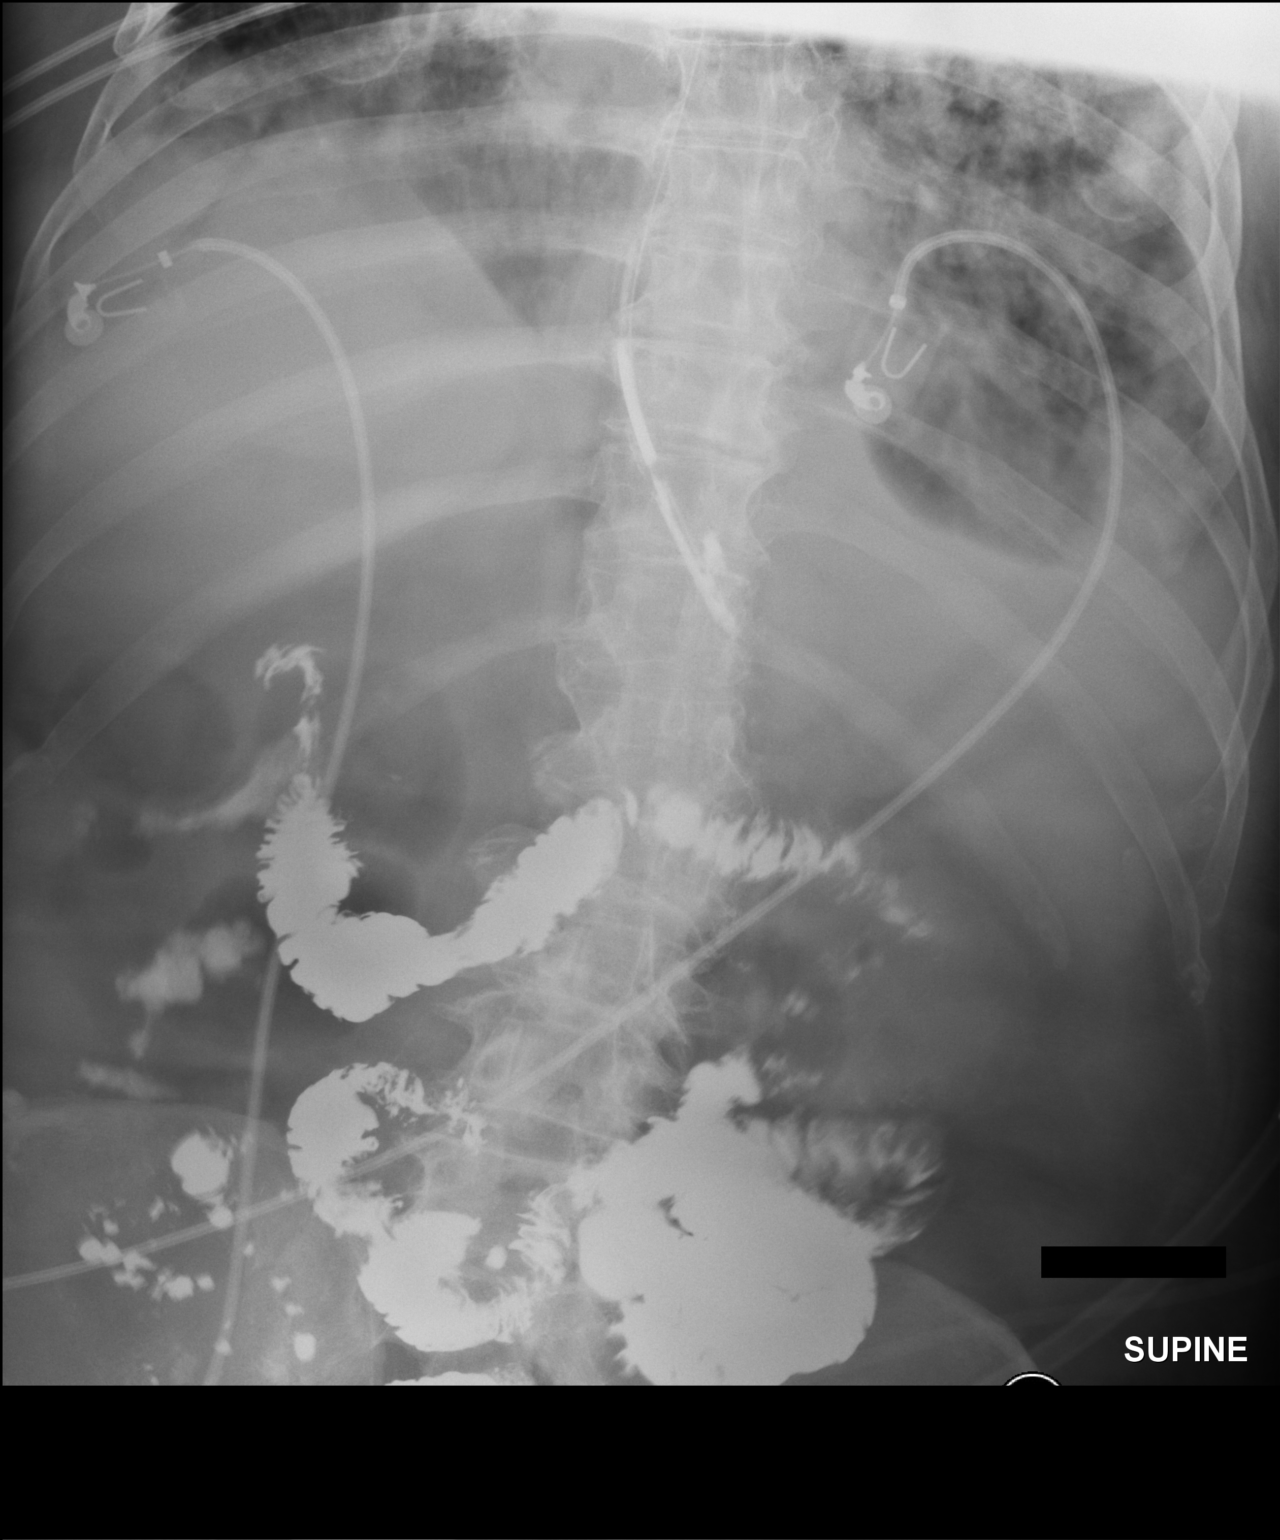

[1 of 1 positions shown; findings below may reference images not displayed]

FINDINGS: A nasogastric tube is in place, tip overlying the level of the
gastroesophageal junction. Consider advancing to further into the
stomach by 10 centimeters.

There is residual contrast in the bowel. Bowel gas pattern appears
non obstructed.
IMPRESSION: Residual contrast in the bowel, presumably following CT of the
abdomen and pelvis on 09/24/2017. Consider bowel preparation to
clear contrast.

## 2019-06-13 IMAGING — DX DG CHEST 1V PORT
1 series · 1 of 1 positions shown · non-contrast
Comparison: Chest x-ray dated October 03, 2017.

CLINICAL DATA: Respiratory failure.

EXAM:
PORTABLE CHEST 1 VIEW

[chest]
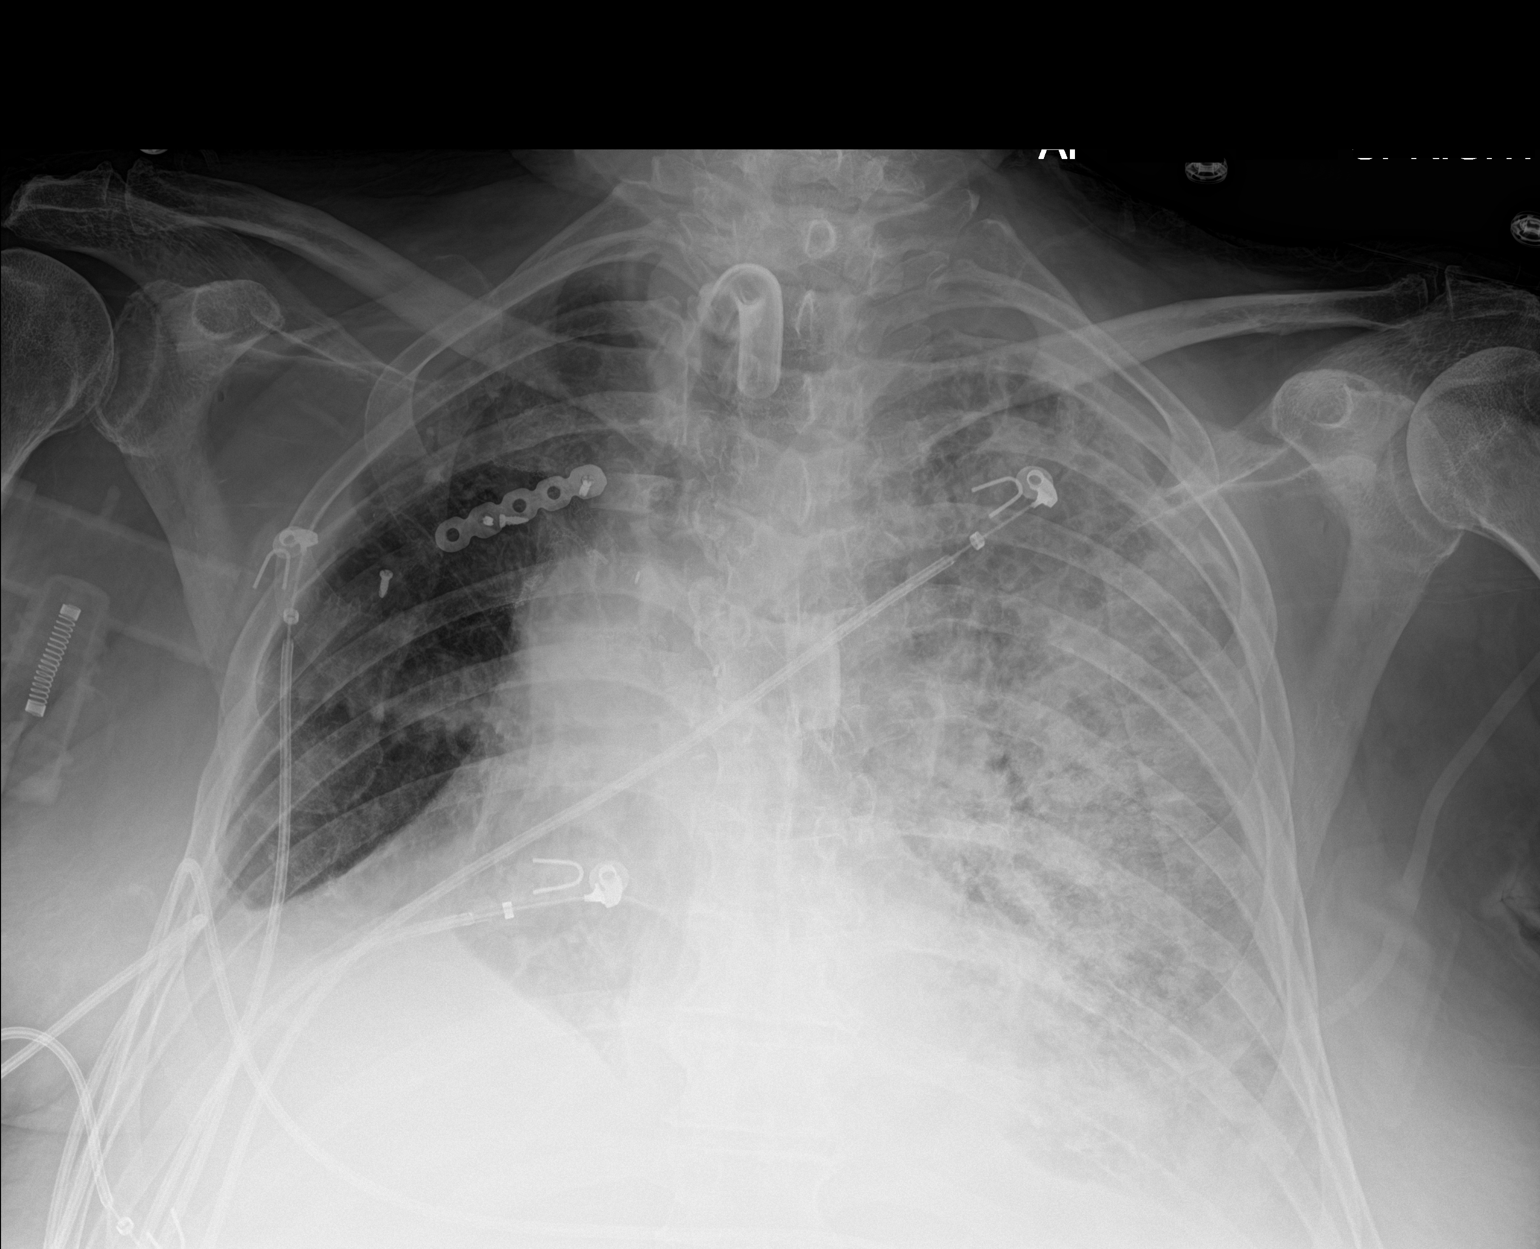

[1 of 1 positions shown; findings below may reference images not displayed]

FINDINGS: The patient remains mildly rotated to the right. Unchanged
tracheostomy tube. Interval removal of the enteric tube. Stable
obscuration of the cardiomediastinal silhouette. Unchanged volume
loss in the right lung and architectural distortion of the hilum
related to prior right upper lobectomy. Diffuse airspace disease
throughout the entire left lung is unchanged. The right lung is
mostly clear. Small bilateral pleural effusions persist. No
pneumothorax. No acute osseous abnormality.
IMPRESSION: Unchanged diffuse airspace disease throughout the entire left lung.
